# Patient Record
Sex: Male | Born: 1935 | Hispanic: Yes | Marital: Married | Smoking: Never smoker
Health system: Southern US, Community
[De-identification: ages and names within clinical notes are randomized; demographics above are authoritative.]

## PROBLEM LIST (undated history)

## (undated) ENCOUNTER — Ambulatory Visit: Payer: Medicaid Other

## (undated) DIAGNOSIS — I10 Essential (primary) hypertension: Secondary | ICD-10-CM

## (undated) DIAGNOSIS — E785 Hyperlipidemia, unspecified: Secondary | ICD-10-CM

## (undated) DIAGNOSIS — K219 Gastro-esophageal reflux disease without esophagitis: Secondary | ICD-10-CM

## (undated) HISTORY — DX: Essential (primary) hypertension: I10

## (undated) HISTORY — DX: Gastro-esophageal reflux disease without esophagitis: K21.9

## (undated) HISTORY — DX: Hyperlipidemia, unspecified: E78.5

---

## 2012-09-18 ENCOUNTER — Emergency Department (HOSPITAL_COMMUNITY): Payer: Self-pay

## 2012-09-18 ENCOUNTER — Inpatient Hospital Stay (HOSPITAL_COMMUNITY)
Admission: EM | Admit: 2012-09-18 | Discharge: 2012-09-23 | DRG: 194 | Disposition: A | Discharge status: Home / Self Care | Payer: MEDICAID | Attending: Internal Medicine | Admitting: Internal Medicine

## 2012-09-18 DIAGNOSIS — E860 Dehydration: Secondary | ICD-10-CM | POA: Diagnosis present

## 2012-09-18 DIAGNOSIS — R112 Nausea with vomiting, unspecified: Secondary | ICD-10-CM | POA: Diagnosis present

## 2012-09-18 DIAGNOSIS — J189 Pneumonia, unspecified organism: Secondary | ICD-10-CM | POA: Diagnosis present

## 2012-09-18 DIAGNOSIS — IMO0001 Reserved for inherently not codable concepts without codable children: Secondary | ICD-10-CM

## 2012-09-18 DIAGNOSIS — R1084 Generalized abdominal pain: Secondary | ICD-10-CM | POA: Diagnosis present

## 2012-09-18 DIAGNOSIS — I10 Essential (primary) hypertension: Secondary | ICD-10-CM | POA: Diagnosis present

## 2012-09-18 DIAGNOSIS — E876 Hypokalemia: Secondary | ICD-10-CM | POA: Diagnosis present

## 2012-09-18 DIAGNOSIS — I517 Cardiomegaly: Secondary | ICD-10-CM | POA: Diagnosis present

## 2012-09-18 DIAGNOSIS — J9819 Other pulmonary collapse: Secondary | ICD-10-CM | POA: Diagnosis present

## 2012-09-18 DIAGNOSIS — I152 Hypertension secondary to endocrine disorders: Secondary | ICD-10-CM | POA: Diagnosis present

## 2012-09-18 LAB — URINALYSIS, ROUTINE W REFLEX MICROSCOPIC
Bilirubin Urine: NEGATIVE
Glucose, UA: NEGATIVE mg/dL
Hgb urine dipstick: NEGATIVE
Ketones, ur: 15 mg/dL — AB
Nitrite: NEGATIVE
Specific Gravity, Urine: 1.017 (ref 1.005–1.030)
pH: 7.5 (ref 5.0–8.0)

## 2012-09-18 LAB — CBC WITH DIFFERENTIAL/PLATELET
Basophils Absolute: 0 10*3/uL (ref 0.0–0.1)
Basophils Relative: 0 % (ref 0–1)
Eosinophils Relative: 0 % (ref 0–5)
HCT: 41.5 % (ref 39.0–52.0)
Hemoglobin: 14.5 g/dL (ref 13.0–17.0)
Lymphocytes Relative: 4 % — ABNORMAL LOW (ref 12–46)
MCHC: 34.9 g/dL (ref 30.0–36.0)
Neutro Abs: 12 10*3/uL — ABNORMAL HIGH (ref 1.7–7.7)
Platelets: 198 10*3/uL (ref 150–400)
RBC: 5.21 MIL/uL (ref 4.22–5.81)
RDW: 13.1 % (ref 11.5–15.5)

## 2012-09-18 LAB — COMPREHENSIVE METABOLIC PANEL
ALT: 16 U/L (ref 0–53)
AST: 22 U/L (ref 0–37)
Albumin: 3.7 g/dL (ref 3.5–5.2)
Alkaline Phosphatase: 110 U/L (ref 39–117)
Chloride: 100 mEq/L (ref 96–112)
Potassium: 3.5 mEq/L (ref 3.5–5.1)
Sodium: 138 mEq/L (ref 135–145)
Total Bilirubin: 0.5 mg/dL (ref 0.3–1.2)
Total Protein: 8.2 g/dL (ref 6.0–8.3)

## 2012-09-18 LAB — URINE MICROSCOPIC-ADD ON

## 2012-09-18 LAB — CG4 I-STAT (LACTIC ACID): Lactic Acid, Venous: 2.13 mmol/L (ref 0.5–2.2)

## 2012-09-18 MED ORDER — HYDROMORPHONE HCL PF 1 MG/ML IJ SOLN
1.0000 mg | Freq: Once | INTRAMUSCULAR | Status: AC
  Administered 2012-09-18: 1 mg via INTRAVENOUS
  Filled 2012-09-18: qty 1

## 2012-09-18 MED ORDER — IOHEXOL 300 MG/ML  SOLN
100.0000 mL | Freq: Once | INTRAMUSCULAR | Status: AC | PRN
  Administered 2012-09-18: 100 mL via INTRAVENOUS

## 2012-09-18 MED ORDER — SODIUM CHLORIDE 0.9 % IV SOLN
1000.0000 mL | INTRAVENOUS | Status: DC
  Administered 2012-09-18: 1000 mL via INTRAVENOUS

## 2012-09-18 MED ORDER — IOHEXOL 300 MG/ML  SOLN: 20.0000 mL | INTRAMUSCULAR | Status: AC

## 2012-09-18 MED ORDER — ONDANSETRON HCL 4 MG/2ML IJ SOLN
4.0000 mg | Freq: Once | INTRAMUSCULAR | Status: AC
  Administered 2012-09-18: 4 mg via INTRAVENOUS
  Filled 2012-09-18: qty 2

## 2012-09-18 MED ORDER — SODIUM CHLORIDE 0.9 % IV SOLN
1000.0000 mL | Freq: Once | INTRAVENOUS | Status: AC
  Administered 2012-09-18: 1000 mL via INTRAVENOUS

## 2012-09-18 MED ORDER — ONDANSETRON HCL 4 MG/2ML IJ SOLN
INTRAMUSCULAR | Status: AC
  Administered 2012-09-18: 4 mg via INTRAVENOUS
  Filled 2012-09-18: qty 2

## 2012-09-18 NOTE — ED Notes (Signed)
02sat at 91 % on room air. Resp 30 pt does not speak any english. Holds his abd and arrived with emesis down both sides of his pants

## 2012-09-18 NOTE — ED Notes (Signed)
CT notified that patient has completed oral contrast.

## 2012-09-18 NOTE — ED Notes (Signed)
pts o2 level was at 88%. I put him on 2L.

## 2012-09-18 NOTE — ED Provider Notes (Signed)
History    CSN: [id number] Arrival date & time 09/18/12  Todd [name]  First MD Initiated Contact with Patient 09/18/12 1934     Chief Complaint  Patient presents with  . Nausea   (Consider location/radiation/quality/duration/timing/severity/associated sxs/prior Treatment) Patient is a 79 y.o. male presenting with abdominal pain. The history is provided by the patient and a relative. A language interpreter was used Banker fluent in Spanish interpreted for him.).  Abdominal Pain This is a new problem. The current episode started 6 to 12 hours ago. Episode frequency: Has had new onset of diffuse abdominal pain, with intermittent nausea and vomiting since 10 A.M. The problem has not changed since onset.Associated symptoms include abdominal pain. Pertinent negatives include no chest pain, no headaches and no shortness of breath. Nothing aggravates the symptoms. Nothing relieves the symptoms. He has tried nothing for the symptoms.   No past medical history on file. No past surgical history on file. No family history on file. History  Substance Use Topics  . Smoking status: Not on file  . Smokeless tobacco: Not on file  . Alcohol Use: Not on file    Review of Systems  Constitutional: Negative for fever and chills.       He had sweating with his vomiting.  HENT: Negative.   Eyes: Negative.   Respiratory: Negative.  Negative for shortness of breath.   Cardiovascular: Negative for chest pain.  Gastrointestinal: Positive for nausea, vomiting and abdominal pain. Negative for diarrhea.  Genitourinary: Negative.   Musculoskeletal: Negative.   Skin:       Profuse sweating with vomiting.  Neurological: Negative.  Negative for headaches.  Psychiatric/Behavioral: Negative.     Allergies  Review of patient's allergies indicates not on file.  Home Medications  No current outpatient prescriptions on file. BP 188/100  Pulse 90  Temp(Src) 98.6 F (37 C) (Oral)  Resp 23  SpO2 95% Physical Exam   Nursing note and vitals reviewed. Constitutional: He is oriented to person, place, and time. He appears well-developed and well-nourished.  In moderate distress, vomited in ED.  HENT:  Head: Normocephalic and atraumatic.  Right Ear: External ear normal.  Left Ear: External ear normal.  Mouth/Throat: Oropharynx is clear and moist.  Eyes: Conjunctivae and EOM are normal. Pupils are equal, round, and reactive to light. No scleral icterus.  Neck: Normal range of motion. Neck supple.  Cardiovascular: Normal rate, regular rhythm and normal heart sounds.   Pulmonary/Chest: Effort normal and breath sounds normal.  Abdominal: Soft. Bowel sounds are normal. He exhibits distension. He exhibits no mass. There is tenderness. There is no rebound and no guarding.  Musculoskeletal: Normal range of motion. He exhibits no edema and no tenderness.  Neurological: He is alert and oriented to person, place, and time.  No sensory or motor deficit.  Skin: Skin is warm and dry.  Psychiatric: He has a normal mood and affect. His behavior is normal.    ED Course  Procedures (including critical care time) Labs Reviewed  COMPREHENSIVE METABOLIC PANEL  LIPASE, BLOOD  URINALYSIS, ROUTINE W REFLEX MICROSCOPIC  CBC WITH DIFFERENTIAL   7:58 PM Pt was seen and had physical examination.  Lab workup was ordered.  IV fluids, IV medications for pain and nausea were ordered.   11:56 PM Results for orders placed during the hospital encounter of 09/18/12  COMPREHENSIVE METABOLIC PANEL      Result Value Range   Sodium 138  135 - 145 mEq/L   Potassium 3.5  3.5 -  5.1 mEq/L   Chloride 100  96 - 112 mEq/L   CO2 26  19 - 32 mEq/L   Glucose, Bld 178 (*) 70 - 99 mg/dL   BUN 12  6 - 23 mg/dL   Creatinine, Ser 8.11  0.50 - 1.35 mg/dL   Calcium 9.1  8.4 - 91.4 mg/dL   Total Protein 8.2  6.0 - 8.3 g/dL   Albumin 3.7  3.5 - 5.2 g/dL   AST 22  0 - 37 U/L   ALT 16  0 - 53 U/L   Alkaline Phosphatase 110  39 - 117 U/L    Total Bilirubin 0.5  0.3 - 1.2 mg/dL   GFR calc non Af Amer 80 (*) >90 mL/min   GFR calc Af Amer >90  >90 mL/min  LIPASE, BLOOD      Result Value Range   Lipase 23  11 - 59 U/L  URINALYSIS, ROUTINE W REFLEX MICROSCOPIC      Result Value Range   Color, Urine YELLOW  YELLOW   APPearance CLOUDY (*) CLEAR   Specific Gravity, Urine 1.017  1.005 - 1.030   pH 7.5  5.0 - 8.0   Glucose, UA NEGATIVE  NEGATIVE mg/dL   Hgb urine dipstick NEGATIVE  NEGATIVE   Bilirubin Urine NEGATIVE  NEGATIVE   Ketones, ur 15 (*) NEGATIVE mg/dL   Protein, ur 30 (*) NEGATIVE mg/dL   Urobilinogen, UA 1.0  0.0 - 1.0 mg/dL   Nitrite NEGATIVE  NEGATIVE   Leukocytes, UA NEGATIVE  NEGATIVE  CBC WITH DIFFERENTIAL      Result Value Range   WBC 12.6 (*) 4.0 - 10.5 K/uL   RBC 5.21  4.22 - 5.81 MIL/uL   Hemoglobin 14.5  13.0 - 17.0 g/dL   HCT 78.2  95.6 - 21.3 %   MCV 79.7  78.0 - 100.0 fL   MCH 27.8  26.0 - 34.0 pg   MCHC 34.9  30.0 - 36.0 g/dL   RDW 08.6  57.8 - 46.9 %   Platelets 198  150 - 400 K/uL   Neutrophils Relative % 95 (*) 43 - 77 %   Neutro Abs 12.0 (*) 1.7 - 7.7 K/uL   Lymphocytes Relative 4 (*) 12 - 46 %   Lymphs Abs 0.5 (*) 0.7 - 4.0 K/uL   Monocytes Relative 1 (*) 3 - 12 %   Monocytes Absolute 0.1  0.1 - 1.0 K/uL   Eosinophils Relative 0  0 - 5 %   Eosinophils Absolute 0.0  0.0 - 0.7 K/uL   Basophils Relative 0  0 - 1 %   Basophils Absolute 0.0  0.0 - 0.1 K/uL  URINE MICROSCOPIC-ADD ON      Result Value Range   Squamous Epithelial / LPF RARE  RARE   WBC, UA 0-2  <3 WBC/hpf   RBC / HPF 0-2  <3 RBC/hpf   Bacteria, UA RARE  RARE  CG4 I-STAT (LACTIC ACID)      Result Value Range   Lactic Acid, Venous 2.13  0.5 - 2.2 mmol/L   Ct Abdomen Pelvis W Contrast  09/18/2012   *RADIOLOGY REPORT*  Clinical Data: Nausea and diffuse abdominal pain.  CT ABDOMEN AND PELVIS WITH CONTRAST  Technique:  Multidetector CT imaging of the abdomen and pelvis was performed following the standard protocol during bolus  administration of intravenous contrast.  Contrast: OMNIPAQUE IOHEXOL 300 MG/ML  SOLN  Comparison: None.  Findings: There are patchy densities at both lung  bases which could represent atelectasis.  Difficult to exclude an infectious process. No evidence for free intraperitoneal air.  Multiple low-density structures in the liver are most compatible with cysts.  The largest is in the lateral left hepatic lobe and measures 3.0 cm.  Normal appearance of the portal venous system and gallbladder.  Normal appearance of the spleen, pancreas and adrenal glands.  There are round low density structures in the right kidney.  The largest measures 1.4 cm and the Hounsfield units are indeterminate, measuring roughly 25.  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  No evidence for an aortic aneurysm.  There is plaque in the right common iliac artery. Urinary bladder is distended.  Few calcifications involving the prostate gland.  Normal appearance of the seminal vesicles.  No gross abnormality to the large bowel.  There is a low density filling defect in a small bowel loop in the mid abdomen on sequence 2, image 55.  This may represent a focal collection of fluid.  No evidence for bowel obstruction or dilatation.  Normal appearance of the appendix.  Degenerative disease at L4-L5.  No acute bony abnormality.  IMPRESSION: No acute abnormalities in the abdomen or pelvis.  Patchy densities in the lower lobes may represent atelectasis but cannot exclude an acute infectious or inflammatory process.  Indeterminate 1.4 cm low density structure involving the right kidney.  Hounsfield units are not compatible with a simple cyst. The structure may be large enough to characterize with ultrasound. Otherwise, this structure could be further evaluated with a pre and postcontrast CT or MR.  Multiple low density structures in the liver are suggestive for cysts.   Original Report Authenticated By: Richarda Overlie, M.D.   12:03  AM Lab workup showed CBC with WBC elevated at 12,600 with 95% neutrophils.  Chemistries showed glucose elevated at 178.  Urinalysis is negative.  CT of the abdomen and pelvis is negative for intra-abdominal pathology. Recheck of the patient shows he feels about the same, with nausea and a feeling of numbness in his body.  He has no abdominal tenderness, moves his arms and legs well, is awake, alert, answers questions posed by me through the nurse.  Recommend admission for observation for vomiting, IV rehydration.  12:43 AM Case discussed with Dr. Onalee Hua.  Admit to observation to a telemetry bed, [facility].  1. Nausea and vomiting       Carleene Cooper III, MD 09/19/12 [id number]

## 2012-09-18 NOTE — ED Notes (Signed)
Pt noted to be throwing up by registration.  Verbal ordered obtained for 4mg  of Zofran.

## 2012-09-18 NOTE — ED Notes (Signed)
Pt slowly drinking PO contrast.  Family assisting pt

## 2012-09-18 NOTE — ED Notes (Signed)
Pt returned from CT. Requesting water instructed pt and family to await CT results before drinking anything.  Pt states that pain med helped "alot"

## 2012-09-18 NOTE — ED Notes (Signed)
Family and Dr. Ignacia Palma to bedside with translator.  Pt family states he has had abd pain, voiting and weakness for 1-2 days.

## 2012-09-18 NOTE — ED Notes (Signed)
Attempted to void.  No success. Family remains at bedside assisting pt with PO contrast

## 2012-09-18 NOTE — ED Notes (Signed)
Pt to CT scanner via stretcher.

## 2012-09-19 ENCOUNTER — Emergency Department (HOSPITAL_COMMUNITY): Payer: Self-pay

## 2012-09-19 ENCOUNTER — Encounter (HOSPITAL_COMMUNITY): Payer: Self-pay | Admitting: *Deleted

## 2012-09-19 DIAGNOSIS — I152 Hypertension secondary to endocrine disorders: Secondary | ICD-10-CM | POA: Diagnosis present

## 2012-09-19 DIAGNOSIS — E860 Dehydration: Secondary | ICD-10-CM | POA: Diagnosis present

## 2012-09-19 DIAGNOSIS — J189 Pneumonia, unspecified organism: Secondary | ICD-10-CM | POA: Diagnosis present

## 2012-09-19 DIAGNOSIS — R112 Nausea with vomiting, unspecified: Secondary | ICD-10-CM | POA: Diagnosis present

## 2012-09-19 DIAGNOSIS — R1084 Generalized abdominal pain: Secondary | ICD-10-CM | POA: Diagnosis present

## 2012-09-19 DIAGNOSIS — R52 Pain, unspecified: Secondary | ICD-10-CM

## 2012-09-19 DIAGNOSIS — R030 Elevated blood-pressure reading, without diagnosis of hypertension: Secondary | ICD-10-CM

## 2012-09-19 LAB — HIV ANTIBODY (ROUTINE TESTING W REFLEX): HIV: NONREACTIVE

## 2012-09-19 LAB — STREP PNEUMONIAE URINARY ANTIGEN: Strep Pneumo Urinary Antigen: NEGATIVE

## 2012-09-19 MED ORDER — ONDANSETRON HCL 4 MG/2ML IJ SOLN
4.0000 mg | Freq: Four times a day (QID) | INTRAMUSCULAR | Status: DC | PRN
  Administered 2012-09-19 – 2012-09-21 (×4): 4 mg via INTRAVENOUS
  Filled 2012-09-19 (×4): qty 2

## 2012-09-19 MED ORDER — SODIUM CHLORIDE 0.9 % IV SOLN
INTRAVENOUS | Status: AC
  Administered 2012-09-19: 03:00:00 via INTRAVENOUS

## 2012-09-19 MED ORDER — ENOXAPARIN SODIUM 40 MG/0.4ML ~~LOC~~ SOLN
40.0000 mg | SUBCUTANEOUS | Status: DC
  Administered 2012-09-19 – 2012-09-23 (×5): 40 mg via SUBCUTANEOUS
  Filled 2012-09-19 (×5): qty 0.4

## 2012-09-19 MED ORDER — DEXTROSE 5 % IV SOLN
500.0000 mg | INTRAVENOUS | Status: DC
  Administered 2012-09-19 – 2012-09-20 (×2): 500 mg via INTRAVENOUS
  Filled 2012-09-19 (×2): qty 500

## 2012-09-19 MED ORDER — SODIUM CHLORIDE 0.9 % IV SOLN: INTRAVENOUS | Status: DC

## 2012-09-19 MED ORDER — ONDANSETRON HCL 4 MG PO TABS: 4.0000 mg | ORAL_TABLET | Freq: Four times a day (QID) | ORAL | Status: DC | PRN

## 2012-09-19 MED ORDER — ONDANSETRON HCL 4 MG/2ML IJ SOLN: 4.0000 mg | INTRAMUSCULAR | Status: DC | PRN

## 2012-09-19 MED ORDER — DEXTROSE 5 % IV SOLN
1.0000 g | INTRAVENOUS | Status: DC
  Administered 2012-09-19 – 2012-09-20 (×2): 1 g via INTRAVENOUS
  Filled 2012-09-19 (×2): qty 10

## 2012-09-19 MED ORDER — HYDRALAZINE HCL 20 MG/ML IJ SOLN
10.0000 mg | Freq: Four times a day (QID) | INTRAMUSCULAR | Status: DC | PRN
  Administered 2012-09-19: 10 mg via INTRAVENOUS
  Filled 2012-09-19: qty 1

## 2012-09-19 MED ORDER — HYDROMORPHONE HCL PF 1 MG/ML IJ SOLN
1.0000 mg | INTRAMUSCULAR | Status: DC | PRN
  Administered 2012-09-19: 1 mg via INTRAVENOUS
  Filled 2012-09-19: qty 1

## 2012-09-19 NOTE — Progress Notes (Signed)
Pt c/o nausea, PRN zofran as ordered, pt having poor po intake, family at bedside, exit care notes on PNA given to pt and family, translator phone used, pt stable

## 2012-09-19 NOTE — Plan of Care (Signed)
Problem: Phase I Progression Outcomes Goal: Pain controlled with appropriate interventions Outcome: Completed/Met Date Met:  09/19/12 Pt has not had any c/o pain Goal: Voiding-avoid urinary catheter unless indicated Outcome: Completed/Met Date Met:  09/19/12 Pt voiding adequate amts of urine no need for foley

## 2012-09-19 NOTE — H&P (Signed)
PCP:   No primary provider on file.   Chief Complaint:  n/v  HPI: 79 yo male with one day of n/v and general abd pain.  Vomit nonbloody.  Has not been feeling well.  No diarrhea.  No fevers.  No cough.  Not seen a doctor in a long time.  W/u in ED reveals ? Pna.  Pt says he feels better.  Mainly spanish speaking.  Review of Systems:  Positive and negative as per HPI otherwise all other systems are negative  Past Medical History: none   Medications: Prior to Admission medications   Not on File    Allergies:  No Known Allergies  Social History: Neg x 3  Family History: none  Physical Exam: Filed Vitals:   09/18/12 2145 09/18/12 2200 09/18/12 2215 09/18/12 2240  BP: 176/96 174/94 172/90 181/87  Pulse: 89 89 93   Temp:      TempSrc:      Resp: 22 22 23    SpO2: 94% 94% 96%    General appearance: alert, cooperative and no distress Head: Normocephalic, without obvious abnormality, atraumatic Eyes: negative Nose: Nares normal. Septum midline. Mucosa normal. No drainage or sinus tenderness. Neck: no JVD and supple, symmetrical, trachea midline Lungs: clear to auscultation bilaterally Heart: regular rate and rhythm, S1, S2 normal, no murmur, click, rub or gallop Abdomen: soft, non-tender; bowel sounds normal; no masses,  no organomegaly Extremities: extremities normal, atraumatic, no cyanosis or edema Pulses: 2+ and symmetric Skin: Skin color, texture, turgor normal. No rashes or lesions Neurologic: Grossly normal    Labs on Admission:   Recent Labs  09/18/12 1957  NA 138  K 3.5  CL 100  CO2 26  GLUCOSE 178*  BUN 12  CREATININE 0.86  CALCIUM 9.1    Recent Labs  09/18/12 1957  AST 22  ALT 16  ALKPHOS 110  BILITOT 0.5  PROT 8.2  ALBUMIN 3.7    Recent Labs  09/18/12 1957  LIPASE 23    Recent Labs  09/18/12 1957  WBC 12.6*  NEUTROABS 12.0*  HGB 14.5  HCT 41.5  MCV 79.7  PLT 198   Radiological Exams on Admission: Dg Chest 2  View  09/19/2012   *RADIOLOGY REPORT*  Clinical Data: Nausea and vomiting for 2 days.  CHEST - 2 VIEW  Comparison: CT of the chest performed 09/18/2012  Findings: The lungs are hypoexpanded.  Bibasilar airspace opacities raise concern for pneumonia, particularly on the left.  No pleural effusion or pneumothorax is seen.  The cardiomediastinal silhouette is borderline enlarged.  No acute osseous abnormalities are identified.  IMPRESSION:  1.  Lungs hypoexpanded.  Bibasilar airspace opacities raise concern for pneumonia, particularly on the left. 2.  Borderline cardiomegaly.   Original Report Authenticated By: Tonia Ghent, M.D.   Ct Abdomen Pelvis W Contrast  09/18/2012   *RADIOLOGY REPORT*  Clinical Data: Nausea and diffuse abdominal pain.  CT ABDOMEN AND PELVIS WITH CONTRAST  Technique:  Multidetector CT imaging of the abdomen and pelvis was performed following the standard protocol during bolus administration of intravenous contrast.  Contrast: OMNIPAQUE IOHEXOL 300 MG/ML  SOLN  Comparison: None.  Findings: There are patchy densities at both lung bases which could represent atelectasis.  Difficult to exclude an infectious process. No evidence for free intraperitoneal air.  Multiple low-density structures in the liver are most compatible with cysts.  The largest is in the lateral left hepatic lobe and measures 3.0 cm.  Normal appearance of the portal  venous system and gallbladder.  Normal appearance of the spleen, pancreas and adrenal glands.  There are round low density structures in the right kidney.  The largest measures 1.4 cm and the Hounsfield units are indeterminate, measuring roughly 25.  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  No evidence for an aortic aneurysm.  There is plaque in the right common iliac artery. Urinary bladder is distended.  Few calcifications involving the prostate gland.  Normal appearance of the seminal vesicles.  No gross abnormality to the large bowel.   There is a low density filling defect in a small bowel loop in the mid abdomen on sequence 2, image 55.  This may represent a focal collection of fluid.  No evidence for bowel obstruction or dilatation.  Normal appearance of the appendix.  Degenerative disease at L4-L5.  No acute bony abnormality.  IMPRESSION: No acute abnormalities in the abdomen or pelvis.  Patchy densities in the lower lobes may represent atelectasis but cannot exclude an acute infectious or inflammatory process.  Indeterminate 1.4 cm low density structure involving the right kidney.  Hounsfield units are not compatible with a simple cyst. The structure may be large enough to characterize with ultrasound. Otherwise, this structure could be further evaluated with a pre and postcontrast CT or MR.  Multiple low density structures in the liver are suggestive for cysts.   Original Report Authenticated By: Richarda Overlie, M.D.    Assessment/Plan  79 yo male with CAP, n/v abd pain Principal Problem:   CAP (community acquired pneumonia) Active Problems:   Abdominal pain, acute, generalized   Nausea & vomiting   Elevated BP   Dehydration, mild  pna pathway. ???also underlying ileus.  abd exam is benign and ct neg.  Place on clear liq diet.  Rocephin and azithro iv.  Probably has also underlying htn.  Supportive care.  Full code.  Tele floor.  Stefanee Mckell A 09/19/2012, 1:25 AM

## 2012-09-19 NOTE — Progress Notes (Signed)
   1:58 PM I agree with HPI/GPe and A/P per Dr. Eldridge Dace       Patient Active Problem List   Diagnosis Date Noted  . CAP (community acquired pneumonia) 09/19/2012  . Abdominal pain, acute, generalized 09/19/2012  . Nausea & vomiting 09/19/2012  . Elevated BP 09/19/2012  . Dehydration, mild 09/19/2012   Doing well.  Hungry. No further n/v-states the vomiting seemed post tussive.  Denies cp, sob, diarrhea etc etc Discussed with patient and will allow diet, cont IVf and ambulate Likely transition to oral abx am  Pleas Koch, MD [facility] [phone]

## 2012-09-20 ENCOUNTER — Observation Stay (HOSPITAL_COMMUNITY): Payer: Self-pay

## 2012-09-20 LAB — LEGIONELLA ANTIGEN, URINE: Legionella Antigen, Urine: NEGATIVE

## 2012-09-20 LAB — COMPREHENSIVE METABOLIC PANEL
ALT: 16 U/L (ref 0–53)
AST: 40 U/L — ABNORMAL HIGH (ref 0–37)
Albumin: 3.4 g/dL — ABNORMAL LOW (ref 3.5–5.2)
Alkaline Phosphatase: 88 U/L (ref 39–117)
BUN: 13 mg/dL (ref 6–23)
CO2: 27 mEq/L (ref 19–32)
Calcium: 8.9 mg/dL (ref 8.4–10.5)
Calcium: 8.9 mg/dL (ref 8.4–10.5)
Chloride: 98 mEq/L (ref 96–112)
Creatinine, Ser: 0.94 mg/dL (ref 0.50–1.35)
GFR calc Af Amer: 75 mL/min — ABNORMAL LOW (ref >90)
GFR calc non Af Amer: 65 mL/min — ABNORMAL LOW (ref >90)
Glucose, Bld: 95 mg/dL (ref 70–99)
Sodium: 140 mEq/L (ref 135–145)

## 2012-09-20 LAB — CBC WITH DIFFERENTIAL/PLATELET
Eosinophils Relative: 1 % (ref 0–5)
HCT: 41.6 % (ref 39.0–52.0)
Hemoglobin: 13.6 g/dL (ref 13.0–17.0)
Lymphocytes Relative: 14 % (ref 12–46)
Lymphs Abs: 1.7 10*3/uL (ref 0.7–4.0)
MCV: 81.1 fL (ref 78.0–100.0)
Monocytes Relative: 5 % (ref 3–12)
Platelets: 200 10*3/uL (ref 150–400)
RBC: 5.13 MIL/uL (ref 4.22–5.81)
WBC: 12.2 10*3/uL — ABNORMAL HIGH (ref 4.0–10.5)

## 2012-09-20 MED ORDER — POTASSIUM CHLORIDE CRYS ER 20 MEQ PO TBCR
40.0000 meq | EXTENDED_RELEASE_TABLET | Freq: Every day | ORAL | Status: DC
  Administered 2012-09-20 – 2012-09-23 (×4): 40 meq via ORAL
  Filled 2012-09-20 (×4): qty 2

## 2012-09-20 MED ORDER — LEVOFLOXACIN 500 MG PO TABS
500.0000 mg | ORAL_TABLET | ORAL | Status: DC
  Administered 2012-09-20 – 2012-09-22 (×3): 500 mg via ORAL
  Filled 2012-09-20 (×4): qty 1

## 2012-09-20 MED ORDER — AMLODIPINE BESYLATE 5 MG PO TABS
5.0000 mg | ORAL_TABLET | Freq: Every day | ORAL | Status: DC
  Administered 2012-09-20 – 2012-09-23 (×4): 5 mg via ORAL
  Filled 2012-09-20 (×4): qty 1

## 2012-09-20 NOTE — Progress Notes (Signed)
Patient c/o of nausea.  PRN IV Zofran given x1.  Patient resting comfortably at bedside in no acute distress. RN will continue to monitor. Louretta Parma, RN

## 2012-09-20 NOTE — Evaluation (Signed)
Physical Therapy Evaluation Patient Details Name: Todd [name] MRN: [id number] DOB: 18-Dec-1930 Today's Date: 09/20/2012 Time: 4098-1191 PT Time Calculation (min): 22 min  PT Assessment / Plan / Recommendation History of Present Illness  79 yo male with one day of n/v and general abd pain.  Vomit nonbloody.  Has not been feeling well.  No diarrhea.  No fevers.  No cough.  Not seen a doctor in a long time.  W/u in ED reveals ? Pna.  Pt says he feels better.  Mainly spanish speaking.  Clinical Impression  Pt admitted with N/V and abdominal pain.  In general not feeling well.  Working dx is PNA.   Pt currently with functional limitations due to the deficits listed below (see PT Problem List).  Pt will benefit from skilled PT to increase their independence and safety with mobility to allow discharge to home with some family assist as needed.     PT Assessment  Patient needs continued PT services    Follow Up Recommendations  No PT follow up ( Unless pt does not progress as expected.)    Does the patient have the potential to tolerate intense rehabilitation      Barriers to Discharge        Equipment Recommendations  None recommended by PT    Recommendations for Other Services     Frequency Min 3X/week    Precautions / Restrictions Precautions Precautions: Fall Restrictions Weight Bearing Restrictions: No   Pertinent Vitals/Pain       Mobility  Bed Mobility Bed Mobility: Supine to Sit;Sitting - Scoot to Edge of Bed;Sit to Supine Supine to Sit: 4: Min assist;HOB flat Sitting - Scoot to Edge of Bed: 4: Min assist Sit to Supine: 4: Min assist Transfers Transfers: Sit to Stand;Stand to Sit Sit to Stand: 4: Min assist;From bed;With upper extremity assist Stand to Sit: 4: Min guard;To bed Details for Transfer Assistance: steadying assist Ambulation/Gait Ambulation/Gait Assistance: 4: Min assist (initially 2 person assist then ) Ambulation Distance (Feet):   (180) Assistive device: 1 person hand held assist;2 person hand held assist Ambulation/Gait Assistance Details: short stacatto steps with arms held at high guard position.  Moderately unsteady when ask to walk unassisted. Gait Pattern: Step-through pattern;Scissoring;Narrow base of support Gait velocity: slower Stairs: No Wheelchair Mobility Wheelchair Mobility: No    Exercises     PT Diagnosis: Difficulty walking;Generalized weakness  PT Problem List: Decreased strength;Decreased activity tolerance;Decreased balance;Decreased mobility;Cardiopulmonary status limiting activity PT Treatment Interventions: Gait training;Stair training;Functional mobility training;Therapeutic activities;Balance training;Patient/family education     PT Goals(Current goals can be found in the care plan section) Acute Rehab PT Goals Patient Stated Goal: get home family stated for him PT Goal Formulation: With patient Time For Goal Achievement: 09/27/12 Potential to Achieve Goals: Good  Visit Information  Last PT Received On: 09/20/12 Assistance Needed: +1 History of Present Illness: 79 yo male with one day of n/v and general abd pain.  Vomit nonbloody.  Has not been feeling well.  No diarrhea.  No fevers.  No cough.  Not seen a doctor in a long time.  W/u in ED reveals ? Pna.  Pt says he feels better.  Mainly spanish speaking.       Prior Functioning  Home Living Family/patient expects to be discharged to:: Private residence Living Arrangements: Spouse/significant other Available Help at Discharge: Family;Other (Comment) (wife and ?dtr to stay about 10 days) Type of Home: House Home Access: Stairs to enter [employer] of Steps: 4  Entrance Stairs-Rails: Right;Left Home Layout: One level Home Equipment: None Additional Comments: stand up shower and low toilets [name] Communication Communication: Prefers language other than English     Cognition  Cognition Arousal/Alertness: Awake/alert Behavior During Therapy: WFL for tasks assessed/performed Overall Cognitive Status: Within Functional Limits for tasks assessed    Extremity/Trunk Assessment Lower Extremity Assessment Lower Extremity Assessment: Generalized weakness Cervical / Trunk Assessment Cervical / Trunk Assessment: Normal   Balance Balance Balance Assessed: No  End of Session PT - End of Session Activity Tolerance: Patient tolerated treatment well;Patient limited by fatigue Patient left: in bed;with call bell/phone within reach;with family/visitor present Nurse Communication: Mobility status  GP     Anyssa Sharpless, Eliseo Gum 09/20/2012, 5:29 PM

## 2012-09-20 NOTE — Progress Notes (Signed)
Interpreter phone used, pt stated he feels fine and wants to go home, pt has not had any c/o n/v or pain today, pts family at bedside, pt still has poor po intake, ensures offered, will continue to monitor

## 2012-09-20 NOTE — Progress Notes (Signed)
[place] ZOX:[id number] DOB: Jul 14, 1930 DOA: 09/18/2012 PCP: No primary provider on file.  Brief narrative: 79 yr old [place] male with no specific chronic illnesses admitted with ? PNa, however CXR equivocal for Pna   Past medical history-As per Problem list Chart reviewed as below- none  Consultants:  none  Procedures:  cxr 7/14  cxr 7/15  Antibiotics:  Azithromcin 7/14  Ceftriaxone 7/14   Subjective  Alert pleasant oriented, nad Had n yesterday no v. No diarr, no tarry stool/black stool  No sob No antecedent LE swelling or dyspnea   Objective    Interim History: nad  Telemetry: nsr  Objective: Filed Vitals:   09/19/12 1841 09/19/12 1900 09/20/12 0505 09/20/12 1335  BP:  163/86 181/90 185/94  Pulse:  92 93 98  Temp: 97.5 F (36.4 C) 98.5 F (36.9 C) 98.1 F (36.7 C) 98 F (36.7 C)  TempSrc: Oral Oral Oral Oral  Resp:  20 16 20   Height:      Weight:   71.3 kg (157 lb 3 oz)   SpO2:  95% 94% 93%    Intake/Output Summary (Last 24 hours) at 09/20/12 1551 Last data filed at 09/20/12 1247  Gross per 24 hour  Intake    420 ml  Output    925 ml  Net   -505 ml    Exam:  General: alert pleasant oreitne din nad Cardiovascular: s1 s2 no m/r/g Respiratory: mild crackels Abdomen: soft nt nd Skin tattoo r leg Neuro intact  Data Reviewed: Basic Metabolic Panel:  Recent Labs Lab 09/18/12 1957 09/20/12 0437  NA 138 140  K 3.5 2.9*  CL 100 102  CO2 26 27  GLUCOSE 178* 95  BUN 12 13  CREATININE 0.86 1.06  CALCIUM 9.1 8.9   Liver Function Tests:  Recent Labs Lab 09/18/12 1957 09/20/12 0437  AST 22 34  ALT 16 16  ALKPHOS 110 88  BILITOT 0.5 0.6  PROT 8.2 7.2  ALBUMIN 3.7 3.2*    Recent Labs Lab 09/18/12 1957  LIPASE 23   No results found for this basename: AMMONIA,  in the last 168 hours CBC:  Recent Labs Lab 09/18/12 1957 09/20/12 0437  WBC 12.6* 12.2*  NEUTROABS 12.0* 9.8*  HGB 14.5 13.6  HCT 41.5  41.6  MCV 79.7 81.1  PLT 198 200   Cardiac Enzymes: No results found for this basename: CKTOTAL, CKMB, CKMBINDEX, TROPONINI,  in the last 168 hours BNP: No components found with this basename: POCBNP,  CBG: No results found for this basename: GLUCAP,  in the last 168 hours  Recent Results (from the past 240 hour(s))  CULTURE, BLOOD (ROUTINE X 2)     Status: None   Collection Time    09/19/12  3:10 AM      Result Value Range Status   Specimen Description BLOOD RIGHT ARM   Final   Special Requests BOTTLES DRAWN AEROBIC ONLY 10CC   Final   Culture  Setup Time 09/19/2012 12:18   Final   Culture     Final   Value:        BLOOD CULTURE RECEIVED NO GROWTH TO DATE CULTURE WILL BE HELD FOR 5 DAYS BEFORE ISSUING A FINAL NEGATIVE REPORT   Report Status PENDING   Incomplete  CULTURE, BLOOD (ROUTINE X 2)     Status: None   Collection Time    09/19/12  3:18 AM      Result Value Range Status   Specimen Description  BLOOD RIGHT HAND   Final   Special Requests BOTTLES DRAWN AEROBIC ONLY 10CC   Final   Culture  Setup Time 09/19/2012 12:18   Final   Culture     Final   Value:        BLOOD CULTURE RECEIVED NO GROWTH TO DATE CULTURE WILL BE HELD FOR 5 DAYS BEFORE ISSUING A FINAL NEGATIVE REPORT   Report Status PENDING   Incomplete     Studies:              All Imaging reviewed and is as per above notation   Scheduled Meds: . enoxaparin (LOVENOX) injection  40 mg Subcutaneous Q24H   Continuous Infusions:    Assessment/Plan: 1. ?PNA-rpt CXr today still equivocal-transition to PO levaquin 500 daily.  Duration 5 days.  CBC am 2. N/v Unclear if post-tussive.  Monitor 3. Htn-Start amlodipine 5.  might need to up-titrate if no control 4. CXR=?CHF-possible given HTN h/o-no h/o Heart disease-at present no indication for lasix for diuresis-suggest out-patient f/u 5. Hypokalemia-replace orally Kdur 40   Code Status: full Family Communication:  Discussed with family at bedside Disposition Plan:  inpt   Pleas Koch, MD  [facility] Pager [phone] 09/20/2012, 3:51 PM    LOS: 2 days

## 2012-09-20 NOTE — Plan of Care (Signed)
Problem: Phase I Progression Outcomes Goal: Initial discharge plan identified Outcome: Completed/Met Date Met:  09/20/12 Initial plan is to return home with family  Problem: Phase II Progression Outcomes Goal: Vital signs remain stable Outcome: Completed/Met Date Met:  09/20/12 vss

## 2012-09-21 DIAGNOSIS — I517 Cardiomegaly: Secondary | ICD-10-CM | POA: Diagnosis present

## 2012-09-21 LAB — CBC
HCT: 42.2 % (ref 39.0–52.0)
MCV: 79.6 fL (ref 78.0–100.0)
RBC: 5.3 MIL/uL (ref 4.22–5.81)
WBC: 9.9 10*3/uL (ref 4.0–10.5)

## 2012-09-21 LAB — COMPREHENSIVE METABOLIC PANEL
AST: 37 U/L (ref 0–37)
BUN: 12 mg/dL (ref 6–23)
CO2: 29 mEq/L (ref 19–32)
Chloride: 101 mEq/L (ref 96–112)
Creatinine, Ser: 1 mg/dL (ref 0.50–1.35)
GFR calc non Af Amer: 69 mL/min — ABNORMAL LOW (ref >90)
Total Bilirubin: 0.5 mg/dL (ref 0.3–1.2)

## 2012-09-21 MED ORDER — LISINOPRIL 10 MG PO TABS
10.0000 mg | ORAL_TABLET | Freq: Every day | ORAL | Status: DC
  Administered 2012-09-21 – 2012-09-22 (×2): 10 mg via ORAL
  Filled 2012-09-21 (×2): qty 1

## 2012-09-21 NOTE — Progress Notes (Signed)
Physical Therapy Treatment Patient Details Name: Todd [name] MRN: [id number] DOB: 1930/08/07 Today's Date: 09/21/2012 Time: 4098-1191 PT Time Calculation (min): 20 min  PT Assessment / Plan / Recommendation  PT Comments   Seen with OT and used interpreter line to introduce therapy and roles.  Son who speaks English entered room shortly after so son ambulated with pt and therapists and assisted with translation.  Educated to use RW for safety due to balance deficits and have 24/7 supervision.  Pt denies pain and SOB with mobility.    Follow Up Recommendations  No PT follow up     Does the patient have the potential to tolerate intense rehabilitation     Barriers to Discharge        Equipment Recommendations  None recommended by PT    Recommendations for Other Services    Frequency     Progress towards PT Goals Progress towards PT goals: Progressing toward goals  Plan Current plan remains appropriate    Precautions / Restrictions Precautions Precautions: Fall Restrictions Weight Bearing Restrictions: No   Pertinent Vitals/Pain See mobility section    Mobility  Bed Mobility Bed Mobility: Not assessed Sit to Supine: 6: Modified independent (Device/Increase time) Transfers Transfers: Sit to Stand;Stand to Sit Sit to Stand: 4: Min guard;From bed Stand to Sit: 4: Min guard;To bed Details for Transfer Assistance: steadying assist Ambulation/Gait Ambulation/Gait Assistance: 4: Min assist Ambulation Distance (Feet): 200 Feet Assistive device: Rolling walker Ambulation/Gait Assistance Details: attempted with no assist however pt with LOB x1 due to narrowing BOS requiring min assist to correct so educated pt and son on safe use of RW Gait Pattern: Step-through pattern;Scissoring;Narrow base of support General Gait Details: SaO2 94% room air during ambulation and 96% room air upon return to room    Exercises Other Exercises Other Exercises: encouraged BU/LE exercises to  perform with family   PT Diagnosis:    PT Problem List:   PT Treatment Interventions:     PT Goals (current goals can now be found in the care plan section) Acute Rehab PT Goals Patient Stated Goal: get home family stated for him  Visit Information  Last PT Received On: 09/21/12 Assistance Needed: +1 History of Present Illness: 79 yo male with one day of n/v and general abd pain.  Vomit nonbloody.  Has not been feeling well.  No diarrhea.  No fevers.  No cough.  Not seen a doctor in a long time.  W/u in ED reveals ? Pna.  Pt says he feels better.  Mainly spanish speaking.    Subjective Data  Patient Stated Goal: get home family stated for him   Cognition  Cognition Arousal/Alertness: Awake/alert Behavior During Therapy: WFL for tasks assessed/performed Overall Cognitive Status: Within Functional Limits for tasks assessed    Balance  Balance Balance Assessed: No High Level Balance High Level Balance Activites: Other (comment) (LOB during ambulation. Improved with RW)  End of Session PT - End of Session Equipment Utilized During Treatment: Gait belt Activity Tolerance: Patient tolerated treatment well Patient left: in bed;with call bell/phone within reach;with family/visitor present   GP     Todd [name],Todd [name] 09/21/2012, 4:08 PM Todd [name], PT, DPT 09/21/2012 Pager: [phone]

## 2012-09-21 NOTE — Progress Notes (Signed)
Pt ambulated in hallway with PT, tolerated well, family at bedside, vss, will monitor

## 2012-09-21 NOTE — Progress Notes (Signed)
Pt c/o having a full feeling after taking meds with nausea at times, pt has difficulty taking pills, meds given with applesauce and tolerated well, no nausea at this time, will continue to monitor

## 2012-09-21 NOTE — Discharge Summary (Addendum)
Physician Discharge Summary  Mar[place] ZOX:[id number] DOB: 04-Jun-1930 DOA: 09/18/2012  PCP: No primary provider on file.  Admit date: 09/18/2012 Discharge date: 09/21/2012  Time spent:  Recommendations for Outpatient Follow-up:  1. Cardiomegaly; patient from [place] and has had limited healthcare. Presented with nausea vomiting and diagnosed with cardiomegaly. 2-D cardiac echo showed severe LVH.. 2. HTN; still uncontrolled Will increase lisinopril to 40 mg daily + continue Norvasc 24+.. labetalol 200 mg twice a day 3. CAP; continue levofloxacin day 4 /7 4. Family conference; conductive family conference with sons and daughters present discussed plan of care with them. Discussed obtaining cardiac echo in the a.m. as well as working on reducing his blood pressure to acceptable level. The concern is that wife is still in [place] and if his vacation is extended how would day go about obtaining a letter to request a visa for wife to come to [place]. Patient has been started on several new medications to control his HTN and improved functioning of his heart will need close followup and support of family  Discharge Diagnoses:  Principal Problem:   CAP (community acquired pneumonia) Active Problems:   Abdominal pain, acute, generalized   Nausea & vomiting   Elevated BP   Dehydration, mild   Discharge Condition: Stable  Diet recommendation: Heart healthy  Filed Weights   09/19/12 0300 09/20/12 0505 09/21/12 0521  Weight: 72.7 kg (160 lb 4.4 oz) 71.3 kg (157 lb 3 oz) 71.714 kg (158 lb 1.6 oz)    History of present illness:  79 yo HM Fm [place] (non-English speaker) visiting family PMHx unknown. Admitted forone day of n/v and general abd pain. Vomit nonbloody. Has not been feeling well. No diarrhea. No fevers. No cough. Not seen a doctor in a long time. Patient is doing well, no adverse reaction to any of the new medications are added, daughter and with  patient.     Hospital Course: 79 year old Hispanic male diagnosed with cardiomegaly and severe LVH most likely secondary to long history of uncontrolled HTN. Had aggressively treated patient's HTN and he'll patient is safe for discharge at this time. Counseled patient that he would need to follow up and establish care with a PCP and continue to work on decreasing his BP.   Procedures: CXR 09/20/2012 1. Cardiomegaly with bibasilar and right perihilar primarily  linear opacities favoring atelectasis, although pneumonia is not  totally excluded.  2. Low lung volumes are present, causing crowding of the pulmonary  Vasculature.  2DCardiac Echo 04/25/2012 Left ventricle: The cavity size was normal. Wall thickness was increased in a pattern of severe LVH. LVEF= 65% to 70%. Doppler parameters are consistent with abnormal left ventricular relaxation (grade 1 diastolic dysfunction).      Consultations:    Discharge Exam: Filed Vitals:   09/21/12 0521 09/21/12 0958 09/21/12 1430 09/21/12 1500  BP: 170/97 188/75 181/92   Pulse: 95  89   Temp: 97.6 F (36.4 C)  98.3 F (36.8 C)   TempSrc: Oral  Oral   Resp: 19  18   Height:      Weight: 71.714 kg (158 lb 1.6 oz)     SpO2: 98%  97% 96%    General:  Alert,NAD (son translated interaction) Cardiovascular:  Regular rhythm and rate, negative murmurs rubs or gallops, DP/PT pulse 2+ Respiratory: clear to auscultation bilateral  Discharge Instructions     Medication List    Notice   You have not been prescribed any medications.  No Known Allergies    The results of significant diagnostics from this hospitalization (including imaging, microbiology, ancillary and laboratory) are listed below for reference.    Significant Diagnostic Studies: Dg Chest 2 View  09/20/2012   *RADIOLOGY REPORT*  Clinical Data: Nausea vomiting.  Diaphoresis.  CHEST - 2 VIEW  Comparison: 09/19/2012  Findings: Very low lung volumes noted with  indistinct left basilar opacities and primarily linear right basilar opacities.  Increased right perihilar band-like opacity. Cardiomegaly is present.  The patient is rotated to the right on today's exam, resulting in reduced diagnostic sensitivity and specificity.  IMPRESSION:  1.  Cardiomegaly with bibasilar and right perihilar primarily linear opacities favoring atelectasis, although pneumonia is not totally excluded. 2. Low lung volumes are present, causing crowding of the pulmonary vasculature.   Original Report Authenticated By: Gaylyn Rong, M.D.   Dg Chest 2 View  09/19/2012   *RADIOLOGY REPORT*  Clinical Data: Nausea and vomiting for 2 days.  CHEST - 2 VIEW  Comparison: CT of the chest performed 09/18/2012  Findings: The lungs are hypoexpanded.  Bibasilar airspace opacities raise concern for pneumonia, particularly on the left.  No pleural effusion or pneumothorax is seen.  The cardiomediastinal silhouette is borderline enlarged.  No acute osseous abnormalities are identified.  IMPRESSION:  1.  Lungs hypoexpanded.  Bibasilar airspace opacities raise concern for pneumonia, particularly on the left. 2.  Borderline cardiomegaly.   Original Report Authenticated By: Tonia Ghent, M.D.   Ct Abdomen Pelvis W Contrast  09/18/2012   *RADIOLOGY REPORT*  Clinical Data: Nausea and diffuse abdominal pain.  CT ABDOMEN AND PELVIS WITH CONTRAST  Technique:  Multidetector CT imaging of the abdomen and pelvis was performed following the standard protocol during bolus administration of intravenous contrast.  Contrast: OMNIPAQUE IOHEXOL 300 MG/ML  SOLN  Comparison: None.  Findings: There are patchy densities at both lung bases which could represent atelectasis.  Difficult to exclude an infectious process. No evidence for free intraperitoneal air.  Multiple low-density structures in the liver are most compatible with cysts.  The largest is in the lateral left hepatic lobe and measures 3.0 cm.  Normal  appearance of the portal venous system and gallbladder.  Normal appearance of the spleen, pancreas and adrenal glands.  There are round low density structures in the right kidney.  The largest measures 1.4 cm and the Hounsfield units are indeterminate, measuring roughly 25.  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  No evidence for an aortic aneurysm.  There is plaque in the right common iliac artery. Urinary bladder is distended.  Few calcifications involving the prostate gland.  Normal appearance of the seminal vesicles.  No gross abnormality to the large bowel.  There is a low density filling defect in a small bowel loop in the mid abdomen on sequence 2, image 55.  This may represent a focal collection of fluid.  No evidence for bowel obstruction or dilatation.  Normal appearance of the appendix.  Degenerative disease at L4-L5.  No acute bony abnormality.  IMPRESSION: No acute abnormalities in the abdomen or pelvis.  Patchy densities in the lower lobes may represent atelectasis but cannot exclude an acute infectious or inflammatory process.  Indeterminate 1.4 cm low density structure involving the right kidney.  Hounsfield units are not compatible with a simple cyst. The structure may be large enough to characterize with ultrasound. Otherwise, this structure could be further evaluated with a pre and postcontrast CT or MR.  Multiple low density  structures in the liver are suggestive for cysts.   Original Report Authenticated By: Richarda Overlie, M.D.    Microbiology: Recent Results (from the past 240 hour(s))  CULTURE, BLOOD (ROUTINE X 2)     Status: None   Collection Time    09/19/12  3:10 AM      Result Value Range Status   Specimen Description BLOOD RIGHT ARM   Final   Special Requests BOTTLES DRAWN AEROBIC ONLY 10CC   Final   Culture  Setup Time 09/19/2012 12:18   Final   Culture     Final   Value:        BLOOD CULTURE RECEIVED NO GROWTH TO DATE CULTURE WILL BE HELD FOR 5 DAYS BEFORE  ISSUING A FINAL NEGATIVE REPORT   Report Status PENDING   Incomplete  CULTURE, BLOOD (ROUTINE X 2)     Status: None   Collection Time    09/19/12  3:18 AM      Result Value Range Status   Specimen Description BLOOD RIGHT HAND   Final   Special Requests BOTTLES DRAWN AEROBIC ONLY 10CC   Final   Culture  Setup Time 09/19/2012 12:18   Final   Culture     Final   Value:        BLOOD CULTURE RECEIVED NO GROWTH TO DATE CULTURE WILL BE HELD FOR 5 DAYS BEFORE ISSUING A FINAL NEGATIVE REPORT   Report Status PENDING   Incomplete     Labs: Basic Metabolic Panel:  Recent Labs Lab 09/18/12 1957 09/20/12 0437 09/20/12 1430 09/21/12 0635  NA 138 140 137 139  K 3.5 2.9* 3.0* 3.4*  CL 100 102 98 101  CO2 26 27 28 29   GLUCOSE 178* 95 144* 103*  BUN 12 13 11 12   CREATININE 0.86 1.06 0.94 1.00  CALCIUM 9.1 8.9 8.9 9.4   Liver Function Tests:  Recent Labs Lab 09/18/12 1957 09/20/12 0437 09/20/12 1430 09/21/12 0635  AST 22 34 40* 37  ALT 16 16 17 19   ALKPHOS 110 88 94 92  BILITOT 0.5 0.6 0.4 0.5  PROT 8.2 7.2 7.6 7.6  ALBUMIN 3.7 3.2* 3.4* 3.1*    Recent Labs Lab 09/18/12 1957  LIPASE 23   No results found for this basename: AMMONIA,  in the last 168 hours CBC:  Recent Labs Lab 09/18/12 1957 09/20/12 0437 09/21/12 0635  WBC 12.6* 12.2* 9.9  NEUTROABS 12.0* 9.8*  --   HGB 14.5 13.6 14.6  HCT 41.5 41.6 42.2  MCV 79.7 81.1 79.6  PLT 198 200 209   Cardiac Enzymes: No results found for this basename: CKTOTAL, CKMB, CKMBINDEX, TROPONINI,  in the last 168 hours BNP: BNP (last 3 results)  Recent Labs  09/20/12 1351  PROBNP 376.3   CBG: No results found for this basename: GLUCAP,  in the last 168 hours     Signed:  Carolyne Littles, J  [facility] 09/21/2012, 5:06 PM

## 2012-09-21 NOTE — Progress Notes (Signed)
Pt not being d/c today per MD, BP elevated, will give meds as ordered and monitor BP, pt and family at bedside while MD used interpreter phone to translate, pt and family verbalized understanding, will monitor

## 2012-09-21 NOTE — Progress Notes (Signed)
Occupational Therapy Evaluation Patient Details Name: Todd [name] MRN: [id number] DOB: May 12, 1930 Today's Date: 09/21/2012 Time: 7829-5621 OT Time Calculation (min): 21 min  OT Assessment / Plan / Recommendation History of present illness 79 yo male with one day of n/v and general abd pain.  Vomit nonbloody.  Has not been feeling well.  No diarrhea.  No fevers.  No cough.  Not seen a doctor in a long time.  W/u in ED reveals ? Pna.  Pt says he feels better.  Mainly spanish speaking.   Clinical Impression   [name] PTA, pt independent with ADL and mobility. Pt presents with decreased endurance and balance, however, much improved with RW. Family can provide 24/7 S. Discussed importance of 24/7 S and assistance s/p D/C. Family verbalized understanding. [name]    OT Assessment  [name]    Follow Up Recommendations  No OT follow up;Supervision/Assistance - 24 hour    Barriers to Discharge      Equipment Recommendations  Other (comment) (RW)    Recommendations for Other Services    Frequency       Precautions / Restrictions Precautions Precautions: Fall Restrictions Weight Bearing Restrictions: No   Pertinent Vitals/Pain O2 96 after ambulation RA    ADL  ADL Comments: overall min A provided by family    OT Diagnosis:    OT Problem List:   OT Treatment Interventions:     OT Goals(Current goals can be found in the care plan section) Acute Rehab OT Goals Patient Stated Goal: get home family stated for him  Visit Information  Last OT Received On: 09/21/12 Assistance Needed: +1 History of Present Illness: 79 yo male with one day of n/v and general abd pain.  Vomit nonbloody.  Has not been feeling well.  No diarrhea.  No fevers.  No cough.  Not seen a doctor in a long time.  W/u in ED reveals ? Pna.  Pt says he feels better.  Mainly spanish speaking.        Prior Functioning     Home Living Family/patient expects to be discharged to:: Private residence Living Arrangements: Spouse/significant other Available Help at Discharge: Family;Other (Comment) (wife and ?dtr to stay about 10 days) Type of Home: House Home Access: Stairs to enter [employer] of Steps: 4 Entrance Stairs-Rails: Right;Left Home Layout: One level Home Equipment: None Additional Comments: stand up shower and low toilets [name] Communication Communication: Prefers language other than English         Vision/Perception     Cognition  Cognition Arousal/Alertness: Awake/alert Behavior During Therapy: WFL for tasks assessed/performed Overall Cognitive Status: Within Functional Limits for tasks assessed    Extremity/Trunk Assessment Upper Extremity Assessment Upper Extremity Assessment: Overall WFL for tasks assessed Lower Extremity Assessment Lower Extremity Assessment: Defer to PT evaluation Cervical / Trunk Assessment Cervical / Trunk Assessment: Normal     Mobility Bed Mobility Bed Mobility: Not assessed Transfers Sit to Stand: 4: Min assist;From bed;With upper extremity assist Stand to Sit: 4: Min guard;To bed Details for Transfer Assistance: steadying assist     Exercise Other Exercises Other Exercises: encouraged BU/LE exercises to perform with family   Balance Balance Balance Assessed: No High Level Balance High Level Balance Activites: Other (comment) (LOB during ambulation. Improved with RW)   End of Session OT - End of Session Equipment Utilized During Treatment:  Gait belt;Rolling walker Activity Tolerance: Patient tolerated treatment well Patient left: in bed;with call bell/phone within reach;with family/visitor present Nurse Communication: Mobility status  GO     Todd [name],HILLARY 09/21/2012, 4:05 PM [facility], OTR/L  [phone] 09/21/2012

## 2012-09-22 DIAGNOSIS — I10 Essential (primary) hypertension: Secondary | ICD-10-CM | POA: Diagnosis present

## 2012-09-22 MED ORDER — LISINOPRIL 40 MG PO TABS
40.0000 mg | ORAL_TABLET | Freq: Every day | ORAL | Status: DC
Start: 2012-09-23 — End: 2012-09-24
  Administered 2012-09-23: 40 mg via ORAL
  Filled 2012-09-22: qty 1

## 2012-09-22 MED ORDER — LISINOPRIL 10 MG PO TABS
10.0000 mg | ORAL_TABLET | Freq: Once | ORAL | Status: AC
  Administered 2012-09-22: 10 mg via ORAL
  Filled 2012-09-22: qty 1

## 2012-09-22 MED ORDER — LISINOPRIL 20 MG PO TABS
20.0000 mg | ORAL_TABLET | Freq: Once | ORAL | Status: AC
  Administered 2012-09-22: 20 mg via ORAL
  Filled 2012-09-22: qty 1

## 2012-09-22 NOTE — Progress Notes (Signed)
  Echocardiogram 2D Echocardiogram has been performed.  Todd [name] [name] 09/22/2012, 9:43 AM

## 2012-09-22 NOTE — Progress Notes (Signed)
Pt mostly asleep during the shift. C/o once of Nausea no vomiting noted. Zofran given, nausea resolved. Continued to monitor patient

## 2012-09-23 DIAGNOSIS — I10 Essential (primary) hypertension: Secondary | ICD-10-CM

## 2012-09-23 DIAGNOSIS — I517 Cardiomegaly: Secondary | ICD-10-CM

## 2012-09-23 MED ORDER — LABETALOL HCL 200 MG PO TABS: 200.0000 mg | ORAL_TABLET | Freq: Two times a day (BID) | ORAL | Status: DC

## 2012-09-23 MED ORDER — LABETALOL HCL 200 MG PO TABS
200.0000 mg | ORAL_TABLET | Freq: Two times a day (BID) | ORAL | Status: DC
Start: 2012-09-23 — End: 2012-09-24
  Administered 2012-09-23: 200 mg via ORAL
  Filled 2012-09-23: qty 1

## 2012-09-23 MED ORDER — LABETALOL HCL 100 MG PO TABS
100.0000 mg | ORAL_TABLET | Freq: Two times a day (BID) | ORAL | Status: DC
  Administered 2012-09-23: 100 mg via ORAL
  Filled 2012-09-23 (×2): qty 1

## 2012-09-23 MED ORDER — LEVOFLOXACIN 500 MG PO TABS: 500.0000 mg | ORAL_TABLET | ORAL | Status: DC

## 2012-09-23 MED ORDER — AMLODIPINE BESYLATE 5 MG PO TABS: 5.0000 mg | ORAL_TABLET | Freq: Every day | ORAL | Status: DC

## 2012-09-23 MED ORDER — LISINOPRIL 40 MG PO TABS: 40.0000 mg | ORAL_TABLET | Freq: Every day | ORAL | Status: DC

## 2012-09-23 NOTE — Progress Notes (Signed)
Physical Therapy Treatment Patient Details Name: Todd [name] MRN: [id number] DOB: 11/10/1930 Today's Date: 09/23/2012 Time: 4098-1191 PT Time Calculation (min): 20 min  PT Assessment / Plan / Recommendation  PT Comments   Pt able to increase ambulation distance and complete stair negotiation.  Continue to recommend RW for long distances.  Also recommended HHPT to improve pt's overall mobility.  Son did not seem interested in RW or HHPT (possible due to finances).  Pt will benefit from HHPT but will be able to safely d/c from PT standpoint with 24 hour assistance if family does not want HHPT.   Follow Up Recommendations  No PT follow up     Equipment Recommendations  Rolling walker with 5" wheels    Frequency Min 3X/week   Progress towards PT Goals Progress towards PT goals: Progressing toward goals  Plan Discharge plan needs to be updated    Precautions / Restrictions Precautions Precautions: Fall   Pertinent Vitals/Pain No c/o pain    Mobility  Bed Mobility Bed Mobility: Supine to Sit;Sit to Supine Supine to Sit: 5: Supervision;With rails Sitting - Scoot to Edge of Bed: 5: Supervision;With rail Sit to Supine: 6: Modified independent (Device/Increase time) Transfers Transfers: Sit to Stand;Stand to Sit Sit to Stand: 4: Min guard;From bed Stand to Sit: 4: Min guard;To bed Details for Transfer Assistance: Minguard for safety with cues for hand placement Ambulation/Gait Ambulation/Gait Assistance: 4: Min assist Ambulation Distance (Feet): 200 Feet Assistive device: Rolling walker Ambulation/Gait Assistance Details: Intermittent min (A) due to LOB x 2 with lateral sway.  Cues for RW placement and increase step length and upright posture. Gait Pattern: Step-through pattern;Scissoring;Narrow base of support;Shuffle Gait velocity: slower Stairs: Yes Stairs Assistance: 4: Min Editor, commissioning Details (indicate cue type and reason): (A) to maintain balance with  cues for step sequence Stair Management Technique: One rail Left;Backwards Number of Stairs: 4 Wheelchair Mobility Wheelchair Mobility: No    Exercises     PT Diagnosis:    PT Problem List:   PT Treatment Interventions:     PT Goals (current goals can now be found in the care plan section) Acute Rehab PT Goals Patient Stated Goal: get home family stated for him PT Goal Formulation: With patient Time For Goal Achievement: 09/27/12 Potential to Achieve Goals: Good  Visit Information  Last PT Received On: 09/23/12 Assistance Needed: +1 History of Present Illness: 79 yo male with one day of n/v and general abd pain.  Vomit nonbloody.  Has not been feeling well.  No diarrhea.  No fevers.  No cough.  Not seen a doctor in a long time.  W/u in ED reveals ? Pna.  Pt says he feels better.  Mainly spanish speaking.    Subjective Data  Subjective: Little verbalization due to spanish speaking.  Son present to translate for pt Patient Stated Goal: get home family stated for him   Cognition  Cognition Arousal/Alertness: Awake/alert Behavior During Therapy: WFL for tasks assessed/performed Overall Cognitive Status: Within Functional Limits for tasks assessed    Balance  Balance Balance Assessed: No  End of Session PT - End of Session Equipment Utilized During Treatment: Gait belt Activity Tolerance: Patient tolerated treatment well Patient left: in bed;with call bell/phone within reach;with family/visitor present Nurse Communication: Mobility status   GP     Todd [name] 09/23/2012, 10:11 AM   [name], PT DPT [phone]

## 2012-09-23 NOTE — Progress Notes (Signed)
The patient rested comfortably throughout the night.

## 2012-09-24 NOTE — Progress Notes (Signed)
The patient was discharged after completing the discharge instructions via an interpreter.  He was taken via wheelchair by a nurse tech to a car with his family.

## 2012-09-25 LAB — CULTURE, BLOOD (ROUTINE X 2): Culture: NO GROWTH

## 2014-07-07 IMAGING — CT CT ABD-PELV W/ CM
2 of 5 series · 16 of 46 positions shown, 18 images · IV contrast (APPLIED)
Comparison: None.

CLINICAL DATA: Nausea and diffuse abdominal pain.

CT ABDOMEN AND PELVIS WITH CONTRAST
TECHNIQUE: Multidetector CT imaging of the abdomen and pelvis was
performed following the standard protocol during bolus
administration of intravenous contrast.
Contrast: 100mL OMNIPAQUE IOHEXOL 300 MG/ML  SOLN

[Series 2: abd/ pelvis 5.0 i30f 1 · axial · 0.74mm/px · z∈[+638,+1054]mm · 13 of 93 slices shown, 15 images]
[im 5/93  soft-tissue]
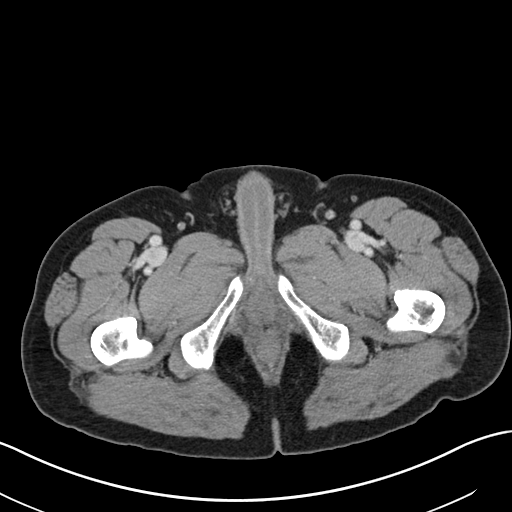
[im 5/93  bone]
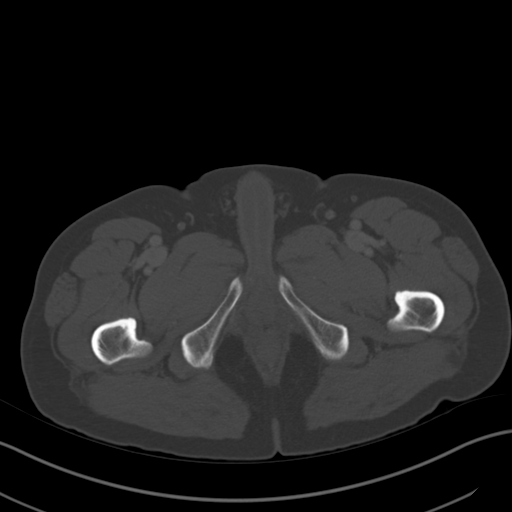
[im 14/93  soft-tissue]
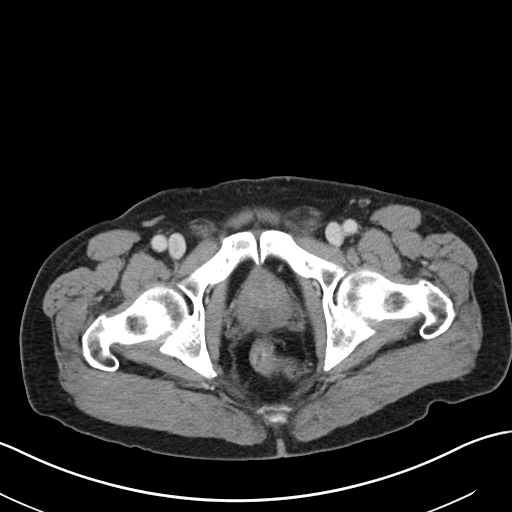
[im 19/93  soft-tissue]
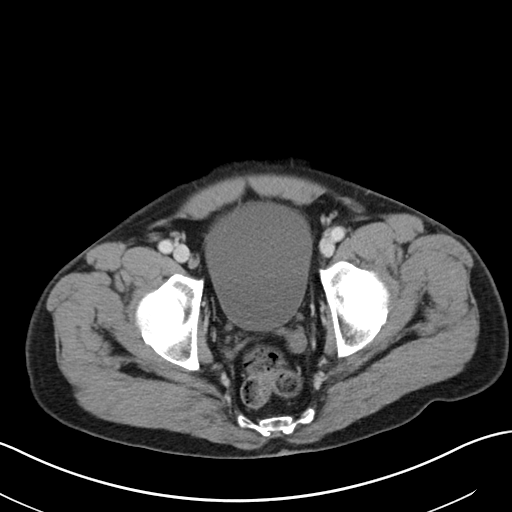
[im 28/93  soft-tissue]
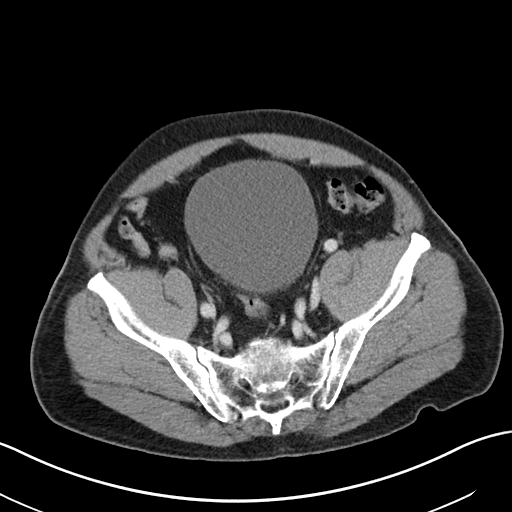
[im 33/93  soft-tissue]
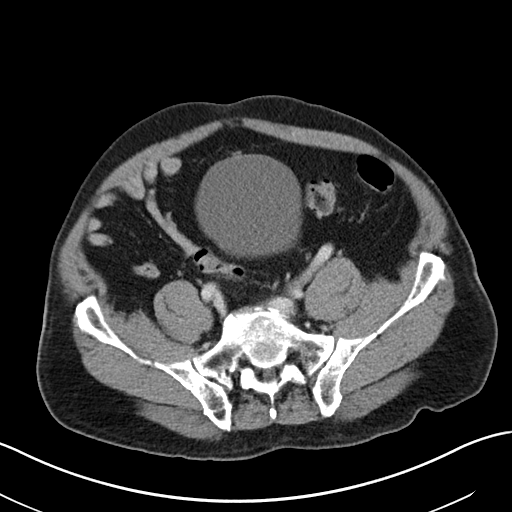
[im 42/93  soft-tissue]
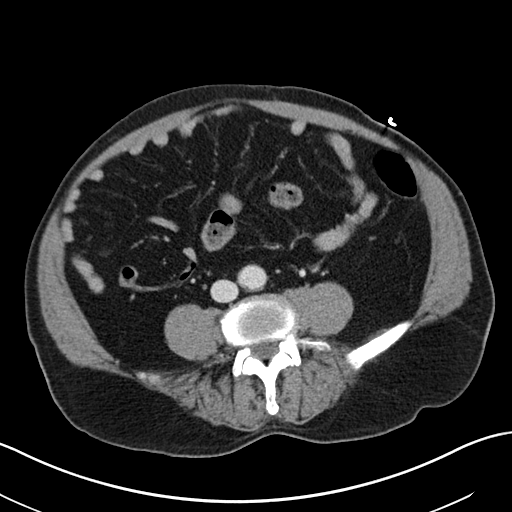
[im 47/93  soft-tissue]
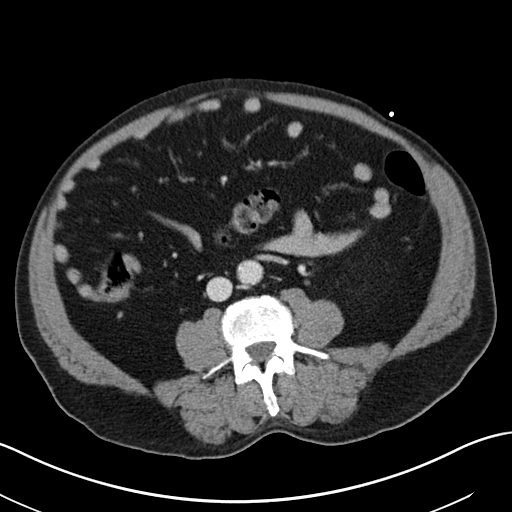
[im 51/93  soft-tissue]
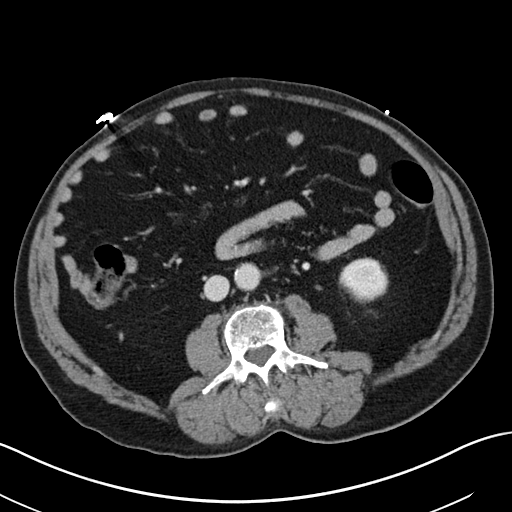
[im 60/93  soft-tissue]
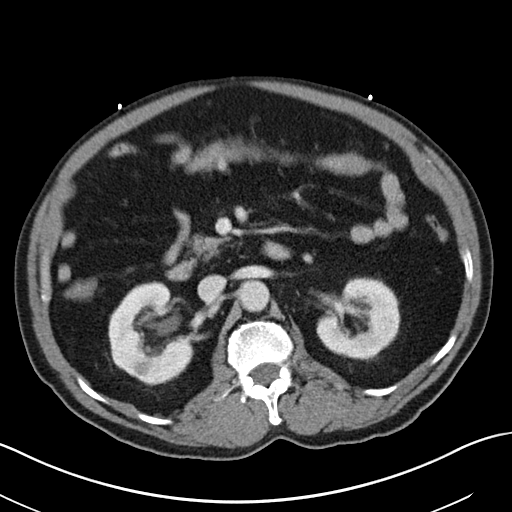
[im 60/93  bone]
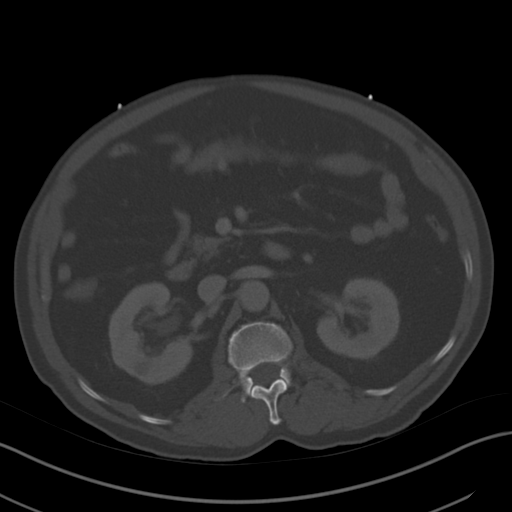
[im 65/93  soft-tissue]
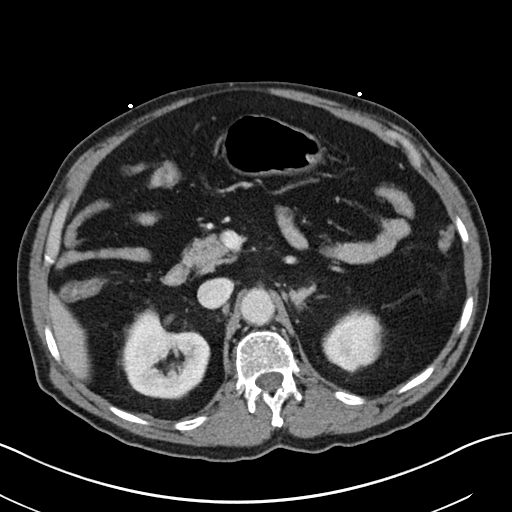
[im 74/93  soft-tissue]
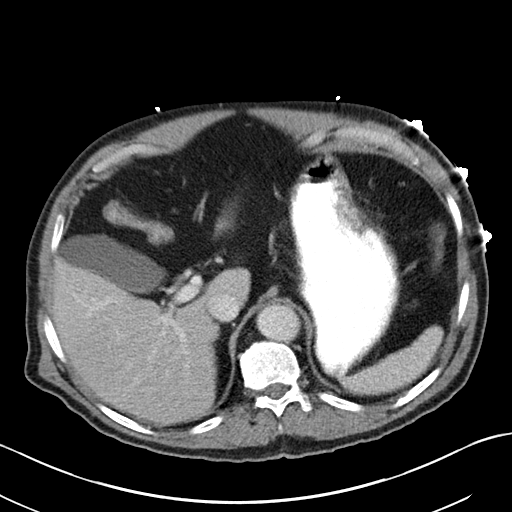
[im 79/93  soft-tissue]
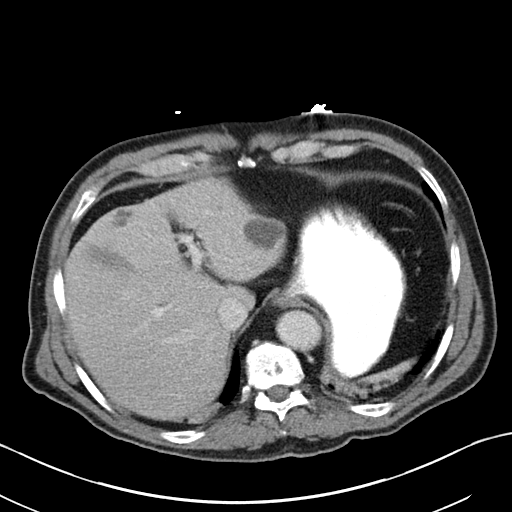
[im 88/93  soft-tissue]
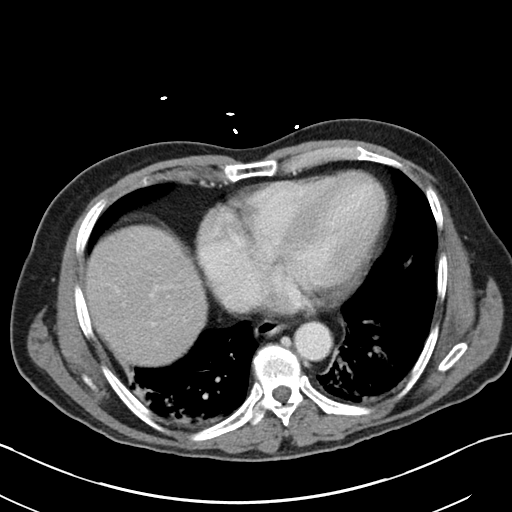

[Series 5: cor · coronal · 0.72mm/px · 3 of 140 slices shown]
[im 47/140  soft-tissue]
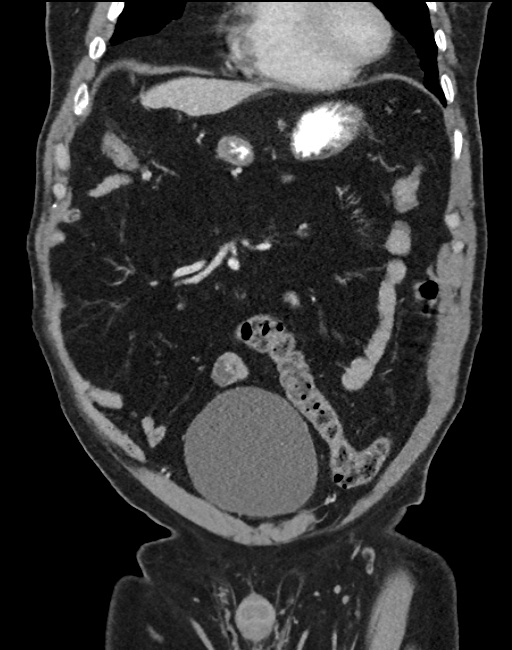
[im 62/140  soft-tissue]
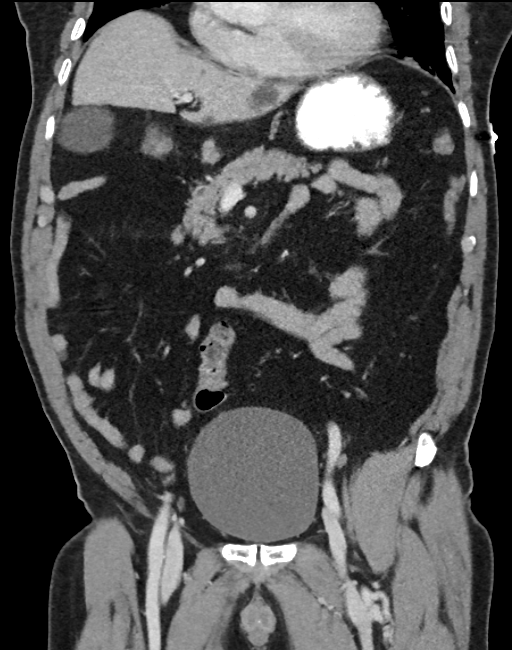
[im 78/140  soft-tissue]
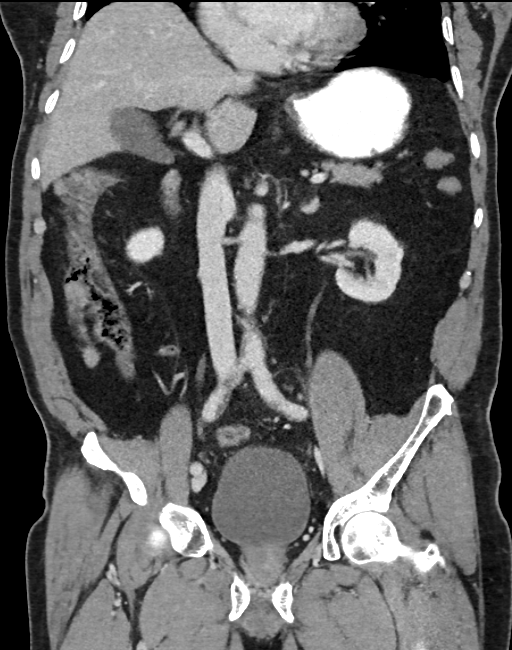

[16 of 46 positions shown; findings below may reference images not displayed]

FINDINGS: There are patchy densities at both lung bases which could
represent atelectasis.  Difficult to exclude an infectious process.
No evidence for free intraperitoneal air.

Multiple low-density structures in the liver are most compatible
with cysts.  The largest is in the lateral left hepatic lobe and
measures 3.0 cm.  Normal appearance of the portal venous system and
gallbladder.  Normal appearance of the spleen, pancreas and adrenal
glands.  There are round low density structures in the right
kidney.  The largest measures 1.4 cm and the Hounsfield units are
indeterminate, measuring roughly 25.  There appears to be
duplicated renal arteries bilaterally.  No evidence for an aortic
aneurysm.  There is plaque in the right common iliac artery.
Urinary bladder is distended.  Few calcifications involving the
prostate gland.  Normal appearance of the seminal vesicles.

No gross abnormality to the large bowel.  There is a low density
filling defect in a small bowel loop in the mid abdomen on sequence
2, image 55.  This may represent a focal collection of fluid.  No
evidence for bowel obstruction or dilatation.  Normal appearance of
the appendix.

Degenerative disease at L4-L5.  No acute bony abnormality.
IMPRESSION: No acute abnormalities in the abdomen or pelvis.

Patchy densities in the lower lobes may represent atelectasis but
cannot exclude an acute infectious or inflammatory process.

Indeterminate 1.4 cm low density structure involving the right
kidney.  Hounsfield units are not compatible with a simple cyst.
The structure may be large enough to characterize with ultrasound.
Otherwise, this structure could be further evaluated with a pre and
postcontrast CT or MR.

Multiple low density structures in the liver are suggestive for
cysts.

## 2014-07-08 IMAGING — CR DG CHEST 2V
2 series · 2 of 2 positions shown · non-contrast
Comparison: CT of the chest performed 09/18/2012

CLINICAL DATA: Nausea and vomiting for 2 days.

CHEST - 2 VIEW

[x chest ap]
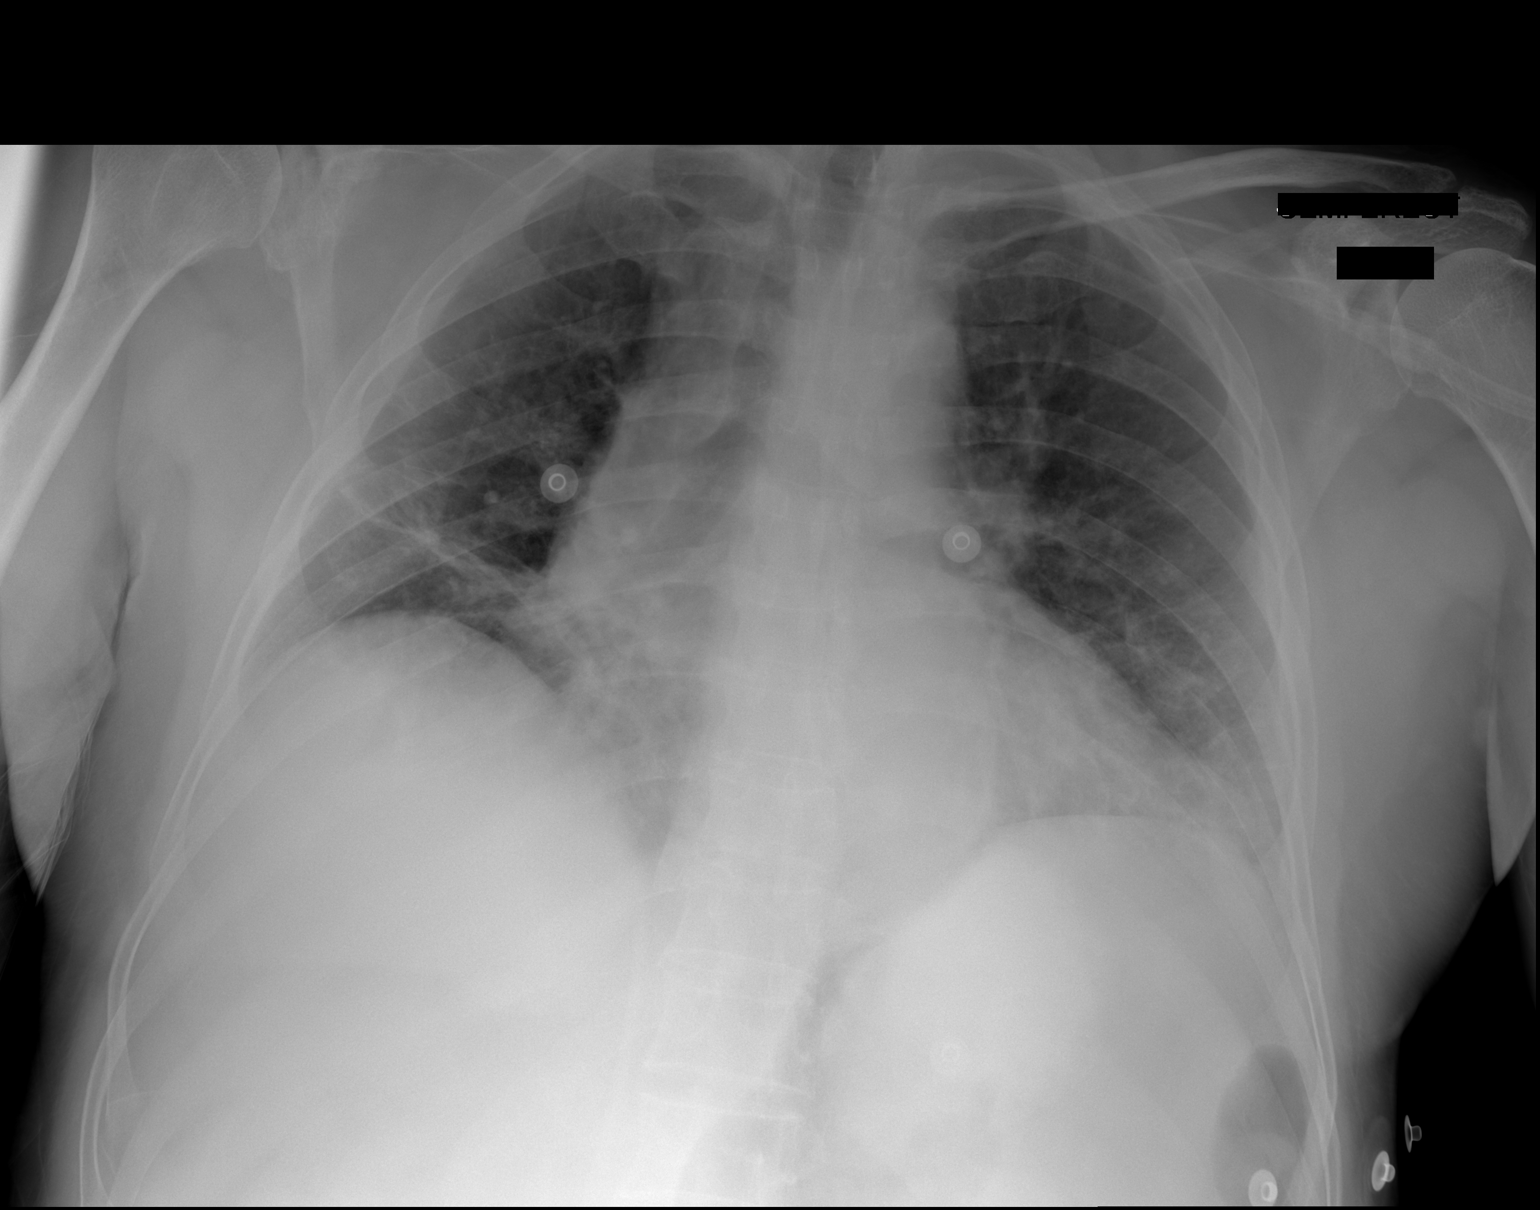

[w chest lat]
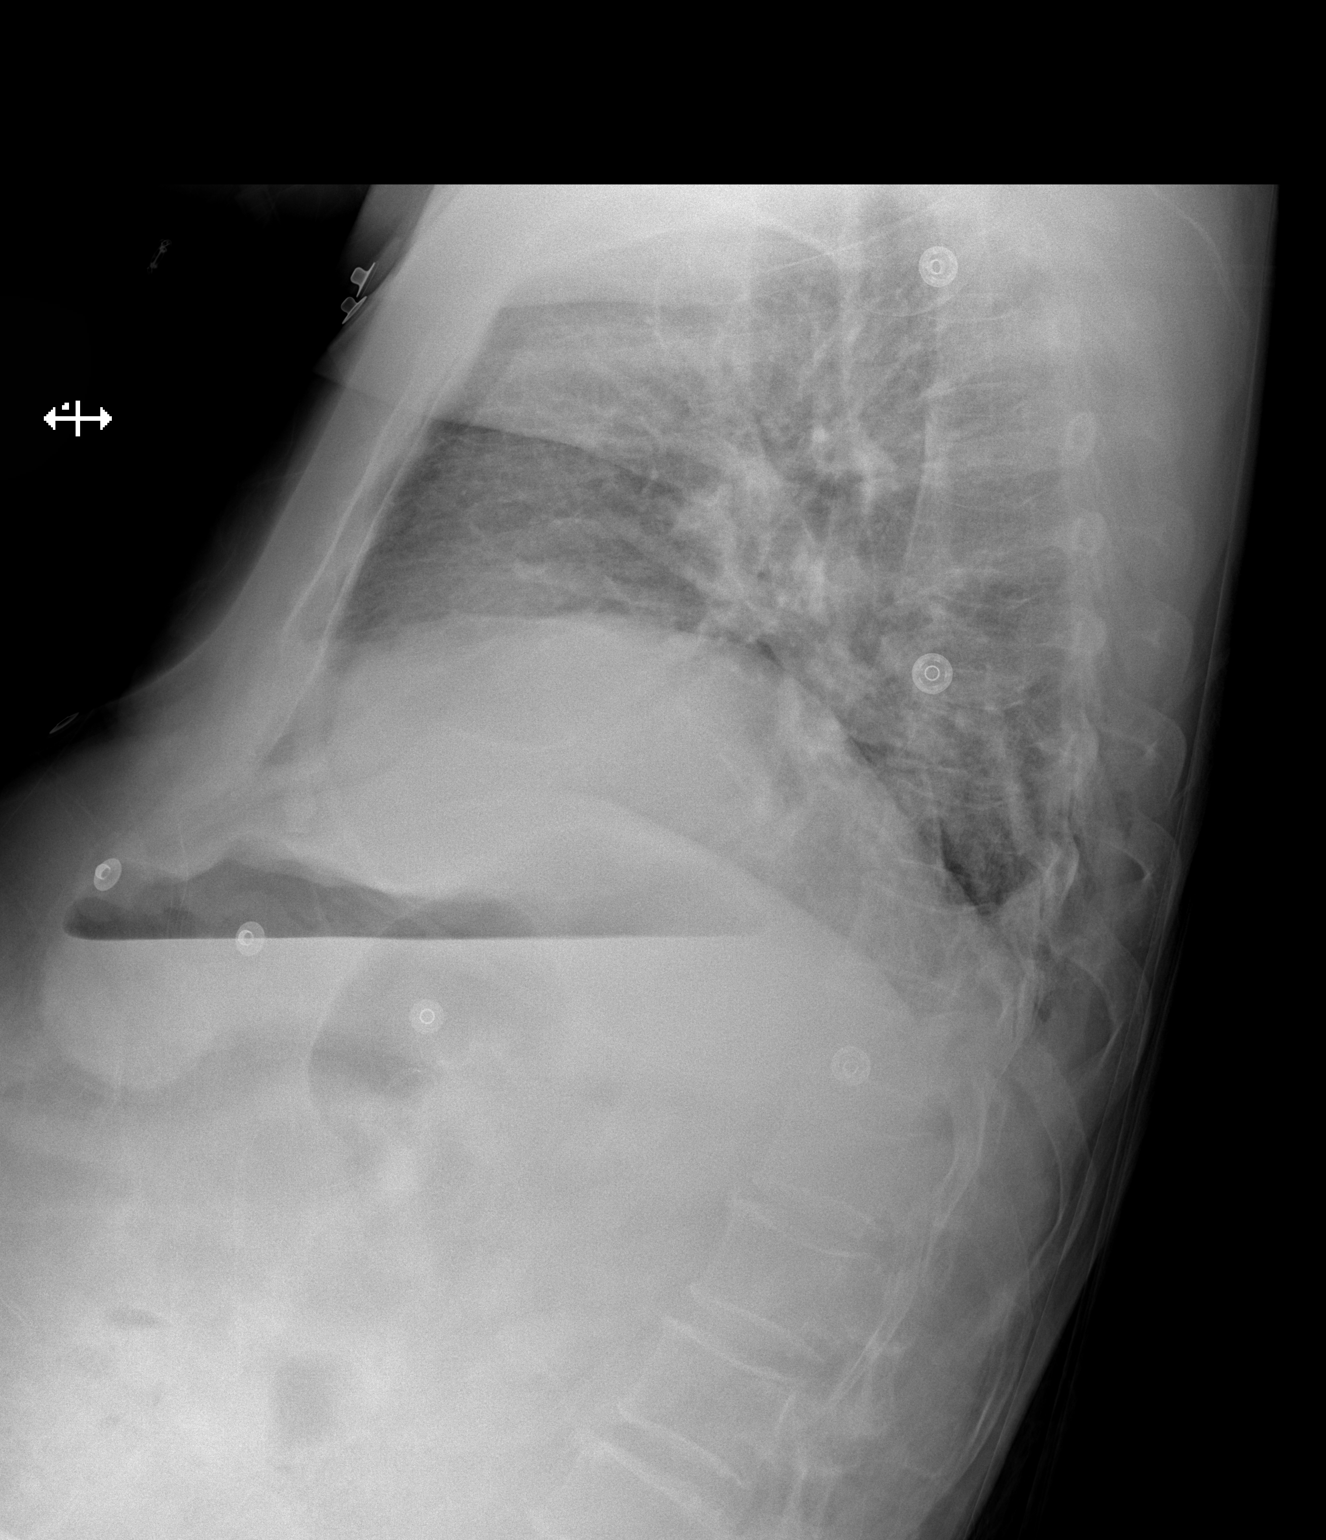

[2 of 2 positions shown; findings below may reference images not displayed]

FINDINGS: The lungs are hypoexpanded.  Bibasilar airspace opacities
raise concern for pneumonia, particularly on the left.  No pleural
effusion or pneumothorax is seen.

The cardiomediastinal silhouette is borderline enlarged.  No acute
osseous abnormalities are identified.
IMPRESSION: 1.  Lungs hypoexpanded.  Bibasilar airspace opacities raise concern
for pneumonia, particularly on the left.
2.  Borderline cardiomegaly.

## 2017-01-05 ENCOUNTER — Encounter: Payer: Self-pay | Admitting: Family Medicine

## 2017-01-05 ENCOUNTER — Ambulatory Visit (INDEPENDENT_AMBULATORY_CARE_PROVIDER_SITE_OTHER): Payer: BLUE CROSS/BLUE SHIELD | Admitting: Family Medicine

## 2017-01-05 VITALS — BP 124/72 | HR 104 | Temp 98.6°F | Resp 18 | Ht 63.0 in | Wt 158.6 lb

## 2017-01-05 DIAGNOSIS — I10 Essential (primary) hypertension: Secondary | ICD-10-CM | POA: Diagnosis not present

## 2017-01-05 DIAGNOSIS — Z7689 Persons encountering health services in other specified circumstances: Secondary | ICD-10-CM | POA: Diagnosis not present

## 2017-01-05 DIAGNOSIS — Z23 Encounter for immunization: Secondary | ICD-10-CM

## 2017-01-05 MED ORDER — AMLODIPINE BESYLATE 5 MG PO TABS: 5.0000 mg | ORAL_TABLET | Freq: Every day | ORAL | 1 refills | Status: DC

## 2017-01-05 NOTE — Progress Notes (Signed)
10/30/20182:44 PM  Todd [name] 1930/03/15, 83 y.o. male [id number]  Chief Complaint  Patient presents with  . New Patient (Initial Visit)    HPI:   Patient is a 83 y.o. male with past medical history significant for HTN who presents today to establish care and requesting refill of his medication.  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 Authorizing Provider  amLODipine (NORVASC) 5 MG tablet Take 1 tablet (5 mg total) by mouth daily. 01/05/17  Yes Myles Lipps, MD    No past medical history on file.  No past surgical history on file.  Social History  Substance Use Topics  . Smoking status: Former Games developer  . Smokeless tobacco: Former Neurosurgeon  . Alcohol use No    No family history on file.  Review of Systems  Constitutional: Negative for chills and fever.  Eyes: Negative for blurred vision and double vision.  Respiratory: Negative for cough and shortness of breath.   Cardiovascular: Negative for chest pain, palpitations and leg swelling.  Gastrointestinal: Negative for abdominal pain, constipation, diarrhea, nausea and vomiting.  Genitourinary: Negative for dysuria, frequency, hematuria and urgency.  Neurological: Positive for tingling and focal weakness.  Psychiatric/Behavioral: Negative for depression. The patient is not nervous/anxious.      OBJECTIVE:  Blood pressure 124/72, pulse (!) 104, temperature 98.6 F (37 C), temperature source  Oral, resp. rate 18, height 5\' 3"  (1.6 m), weight 158 lb 9.6 oz (71.9 kg), SpO2 96 %.  Physical Exam  Constitutional: He is oriented to person, place, and time and well-developed, well-nourished, and in no distress.  HENT:  Head: Normocephalic and atraumatic.  Right Ear: Hearing, tympanic membrane, external ear and ear canal normal.  Left Ear: Hearing, tympanic membrane, external ear and ear canal normal.  Mouth/Throat: Oropharynx is clear and moist. No oropharyngeal exudate.  Eyes: Pupils are equal, round, and reactive to light. Conjunctivae and EOM are normal.  Neck: Neck supple.  Cardiovascular: Normal rate and regular rhythm.  Exam reveals no gallop and no friction rub.   No murmur heard. Pulmonary/Chest: Effort normal and breath sounds normal. He has no wheezes. He has no rales.  Abdominal: Soft. Bowel sounds are normal. He exhibits no distension. There is no hepatosplenomegaly. There is no tenderness. A hernia is present. Hernia confirmed positive in the umbilical area and confirmed positive in the ventral area.  Lymphadenopathy:    He has no cervical adenopathy.  Neurological: He is alert and oriented to person, place, and time. Gait normal.  Skin: Skin is warm and dry.    ASSESSMENT and PLAN  1. Essential hypertension, benign At goal, cont current regime - amLODipine (NORVASC) 5 MG tablet; Take 1 tablet (5 mg total) by mouth daily.  2. Encounter to establish care PMH, PSH, meds, allergies, Fhx, Shx, reviewed with patient today.  3. Need for vaccination - Flu Vaccine QUAD 36+ mos IM given today    Return in  about 6 months (around 07/06/2017).    Myles LippsIrma M Santiago, MD [facility] at [facility] [address] [place], [place] [address] Ph.  [phone] Fax [phone]

## 2017-01-05 NOTE — Addendum Note (Signed)
Addended by: Myles LippsSANTIAGO, Martrell Eguia M on: 01/05/2017 03:38 PM   Modules accepted: Orders

## 2017-01-05 NOTE — Patient Instructions (Signed)
     IF you received an x-ray today, you will receive an invoice from [facility]. Please contact [facility] at [phone] with questions or concerns regarding your invoice.   IF you received labwork today, you will receive an invoice from [facility]. Please contact [facility] at [phone] with questions or concerns regarding your invoice.   Our billing staff will not be able to assist you with questions regarding bills from these companies.  You will be contacted with the lab results as soon as they are available. The fastest way to get your results is to activate your My Chart account. Instructions are located on the last page of this paperwork. If you have not heard from us regarding the results in 2 weeks, please contact this office.     

## 2017-01-06 LAB — CBC WITH DIFFERENTIAL/PLATELET
Basophils Absolute: 0.1 10*3/uL (ref 0.0–0.2)
Basos: 1 %
EOS (ABSOLUTE): 0.2 10*3/uL (ref 0.0–0.4)
Eos: 2 %
Hematocrit: 47 % (ref 37.5–51.0)
Hemoglobin: 15.7 g/dL (ref 13.0–17.7)
Immature Grans (Abs): 0 10*3/uL (ref 0.0–0.1)
Immature Granulocytes: 0 %
Lymphocytes Absolute: 1.8 10*3/uL (ref 0.7–3.1)
Lymphs: 23 %
MCH: 26.7 pg (ref 26.6–33.0)
MCHC: 33.4 g/dL (ref 31.5–35.7)
MCV: 80 fL (ref 79–97)
Monocytes Absolute: 0.5 10*3/uL (ref 0.1–0.9)
Monocytes: 7 %
Neutrophils Absolute: 5.3 10*3/uL (ref 1.4–7.0)
Neutrophils: 67 %
Platelets: 199 10*3/uL (ref 150–379)
RBC: 5.89 x10E6/uL — ABNORMAL HIGH (ref 4.14–5.80)
RDW: 15 % (ref 12.3–15.4)
WBC: 7.8 10*3/uL (ref 3.4–10.8)

## 2017-01-06 LAB — COMPREHENSIVE METABOLIC PANEL
ALT: 25 IU/L (ref 0–44)
AST: 27 IU/L (ref 0–40)
Albumin/Globulin Ratio: 1.4 (ref 1.2–2.2)
Albumin: 4.7 g/dL (ref 3.5–4.7)
Alkaline Phosphatase: 108 IU/L (ref 39–117)
BUN/Creatinine Ratio: 12 (ref 10–24)
BUN: 13 mg/dL (ref 8–27)
Bilirubin Total: 0.3 mg/dL (ref 0.0–1.2)
CO2: 26 mmol/L (ref 20–29)
Calcium: 9.6 mg/dL (ref 8.6–10.2)
Chloride: 101 mmol/L (ref 96–106)
Creatinine, Ser: 1.11 mg/dL (ref 0.76–1.27)
GFR calc Af Amer: 70 mL/min/{1.73_m2} (ref >59)
GFR calc non Af Amer: 61 mL/min/{1.73_m2} (ref >59)
Globulin, Total: 3.4 g/dL (ref 1.5–4.5)
Glucose: 100 mg/dL — ABNORMAL HIGH (ref 65–99)
Potassium: 4.3 mmol/L (ref 3.5–5.2)
Sodium: 143 mmol/L (ref 134–144)
Total Protein: 8.1 g/dL (ref 6.0–8.5)

## 2017-01-06 LAB — LIPID PANEL
Chol/HDL Ratio: 7.2 ratio — ABNORMAL HIGH (ref 0.0–5.0)
Cholesterol, Total: 230 mg/dL — ABNORMAL HIGH (ref 100–199)
HDL: 32 mg/dL — ABNORMAL LOW (ref >39)
LDL Calculated: 155 mg/dL — ABNORMAL HIGH (ref 0–99)
Triglycerides: 214 mg/dL — ABNORMAL HIGH (ref 0–149)
VLDL Cholesterol Cal: 43 mg/dL — ABNORMAL HIGH (ref 5–40)

## 2017-01-06 LAB — TSH: TSH: 3.91 u[IU]/mL (ref 0.450–4.500)

## 2017-07-23 ENCOUNTER — Encounter: Payer: Self-pay | Admitting: Family Medicine

## 2017-07-23 ENCOUNTER — Ambulatory Visit: Payer: BLUE CROSS/BLUE SHIELD | Admitting: Family Medicine

## 2017-07-23 ENCOUNTER — Other Ambulatory Visit: Payer: Self-pay

## 2017-07-23 VITALS — BP 120/74 | HR 89 | Temp 98.2°F | Ht 62.99 in | Wt 159.0 lb

## 2017-07-23 DIAGNOSIS — Z23 Encounter for immunization: Secondary | ICD-10-CM

## 2017-07-23 DIAGNOSIS — I10 Essential (primary) hypertension: Secondary | ICD-10-CM | POA: Diagnosis not present

## 2017-07-23 DIAGNOSIS — G5603 Carpal tunnel syndrome, bilateral upper limbs: Secondary | ICD-10-CM

## 2017-07-23 MED ORDER — AMLODIPINE BESYLATE 5 MG PO TABS: 5.0000 mg | ORAL_TABLET | Freq: Every day | ORAL | 1 refills | Status: DC

## 2017-07-23 NOTE — Progress Notes (Signed)
   5/17/201910:55 AM  [name] 1930/08/25, 84 y.o. male [id number]  Chief Complaint  Patient presents with  . Medication Refill    HPI:   Patient is a 84 y.o. male with past medical history significant for HTN who presents today requesting medication refill He reports takes meds as prescribed Denies any side effects Otherwise c/o intermittent bilateral numbness and tingling of 2nd and 3rd fingers of both hands Denies any weakness, not dropping objects  Fall Risk  07/23/2017 01/05/2017  Falls in the past year? No No     Depression screen [facility] 2/9 07/23/2017 01/05/2017  Decreased Interest 0 0  Down, Depressed, Hopeless 0 0  PHQ - 2 Score 0 0    No Known Allergies  Prior to Admission medications   Medication Sig Start Date End Date Taking? Authorizing Provider  amLODipine (NORVASC) 5 MG tablet Take 1 tablet (5 mg total) by mouth daily. 01/05/17  Yes Myles Lipps, MD    History reviewed. No pertinent past medical history.  History reviewed. No pertinent surgical history.  Social History   Tobacco Use  . Smoking status: Former Games developer  . Smokeless tobacco: Former Engineer, water Use Topics  . Alcohol use: No    History reviewed. No pertinent family history.  Review of Systems  Constitutional: Negative for chills and fever.  Respiratory: Negative for cough and shortness of breath.   Cardiovascular: Negative for chest pain, palpitations and leg swelling.  Gastrointestinal: Negative for abdominal pain, nausea and vomiting.     OBJECTIVE:  Blood pressure 120/74, pulse 89, temperature 98.2 F (36.8 C), temperature source Oral, height 5' 2.99" (1.6 m), weight 159 lb (72.1 kg), SpO2 96 %.  Physical Exam  Constitutional: He is oriented to person, place, and time. He appears well-developed and well-nourished.  HENT:  Head: Normocephalic and atraumatic.  Mouth/Throat: Oropharynx is clear and moist.  Eyes: Pupils are equal, round, and reactive to light.  Conjunctivae and EOM are normal.  Neck: Neck supple.  Cardiovascular: Normal rate and regular rhythm. Exam reveals no gallop and no friction rub.  No murmur heard. Pulmonary/Chest: Effort normal and breath sounds normal. He has no wheezes. He has no rales.  Musculoskeletal:       Right wrist: He exhibits normal range of motion, no tenderness and no swelling.       Left wrist: He exhibits normal range of motion, no tenderness and no swelling.  + tinel and phalen signs bilaterally  Neurological: He is alert and oriented to person, place, and time.  Skin: Skin is warm and dry.  Psychiatric: He has a normal mood and affect.  Nursing note and vitals reviewed.   ASSESSMENT and PLAN  1. Essential hypertension, benign Controlled, cont current regime  2. Need for vaccination  3. Bilateral carpal tunnel syndrome Discussed supportive measures, such as using an appropriate brace at bedtime. OTC nsaids use prn.  Patient educational handout given.  Other orders - Pneumococcal conjugate vaccine 13-valent IM - amLODipine (NORVASC) 5 MG tablet; Take 1 tablet (5 mg total) by mouth daily.  Return in about 6 months (around 01/23/2018).    Myles Lipps, MD Primary Care at [facility] [address] [place], [place] [address] Ph.  [phone] Fax [phone]

## 2017-07-23 NOTE — Patient Instructions (Addendum)
     IF you received an x-ray today, you will receive an invoice from [facility]. Please contact [facility] at [phone] with questions or concerns regarding your invoice.   IF you received labwork today, you will receive an invoice from [facility]. Please contact [facility] at [phone] with questions or concerns regarding your invoice.   Our billing staff will not be able to assist you with questions regarding bills from these companies.  You will be contacted with the lab results as soon as they are available. The fastest way to get your results is to activate your My Chart account. Instructions are located on the last page of this paperwork. If you have not heard from us regarding the results in 2 weeks, please contact this office.     Sndrome del tnel carpiano (Carpal Tunnel Syndrome) El sndrome del tnel carpiano es una afeccin que causa dolor en la mano y en el brazo. El tnel carpiano es un espacio estrecho ubicado en el lado palmar de la mueca. Los movimientos repetidos de la mueca o determinadas enfermedades pueden causar la hinchazn del tnel. Esta hinchazn puede comprimir el nervio principal de la mueca (nervio mediano). CUIDADOS EN EL HOGAR Si tiene una frula:  sela como se lo haya indicado el mdico. Qutesela solamente como se lo haya indicado el mdico.  Afloje la frula si los dedos: ? Se le adormecen y siente hormigueos. ? Se le ponen azulados y fros.  Mantenga la frula limpia y seca. Instrucciones generales  Tome los medicamentos de venta libre y los recetados solamente como se lo haya indicado el mdico.  Haga reposar la mueca de toda actividad que le cause dolor. Si es necesario, hable con su empleador sobre los cambios que pueden hacerse en su lugar de trabajo, por ejemplo, usar una almohadilla para apoyar la mueca mientras tipea.  Si se lo indican, aplique hielo sobre la zona dolorida: ? Ponga el hielo en una bolsa  plstica. ? Coloque una toalla entre la piel y la bolsa de hielo. ? Coloque el hielo durante 20minutos, 2 a 3veces por da.  Concurra a todas las visitas de control como se lo haya indicado el mdico. Esto es importante.  Haga los ejercicios como se lo hayan indicado el mdico, el fisioterapeuta o el terapeuta ocupacional. SOLICITE AYUDA SI:  Aparecen nuevos sntomas.  Los medicamentos no le alivian el dolor.  Los sntomas empeoran. Esta informacin no tiene como fin reemplazar el consejo del mdico. Asegrese de hacerle al mdico cualquier pregunta que tenga. Document Released: 02/12/2011 Document Revised: 11/14/2014 Document Reviewed: 07/11/2014 Elsevier Interactive Patient Education  2018 Elsevier Inc.  

## 2017-08-13 ENCOUNTER — Encounter: Payer: Self-pay | Admitting: Family Medicine

## 2017-10-01 ENCOUNTER — Ambulatory Visit: Payer: BLUE CROSS/BLUE SHIELD | Admitting: Urgent Care

## 2017-10-01 ENCOUNTER — Other Ambulatory Visit: Payer: Self-pay

## 2017-10-01 ENCOUNTER — Encounter: Payer: Self-pay | Admitting: Urgent Care

## 2017-10-01 VITALS — BP 130/68 | HR 89 | Temp 98.7°F | Resp 16 | Ht 62.99 in | Wt 156.8 lb

## 2017-10-01 DIAGNOSIS — R1310 Dysphagia, unspecified: Secondary | ICD-10-CM | POA: Diagnosis not present

## 2017-10-01 DIAGNOSIS — E782 Mixed hyperlipidemia: Secondary | ICD-10-CM | POA: Diagnosis not present

## 2017-10-01 DIAGNOSIS — R1111 Vomiting without nausea: Secondary | ICD-10-CM

## 2017-10-01 DIAGNOSIS — I10 Essential (primary) hypertension: Secondary | ICD-10-CM | POA: Diagnosis not present

## 2017-10-01 MED ORDER — ATORVASTATIN CALCIUM 10 MG PO TABS: 10.0000 mg | ORAL_TABLET | Freq: Every day | ORAL | 3 refills | Status: DC

## 2017-10-01 MED ORDER — AMLODIPINE BESYLATE 5 MG PO TABS: 5.0000 mg | ORAL_TABLET | Freq: Every day | ORAL | 1 refills | Status: DC

## 2017-10-01 MED ORDER — RANITIDINE HCL 150 MG PO TABS: 150.0000 mg | ORAL_TABLET | Freq: Two times a day (BID) | ORAL | 1 refills | Status: DC

## 2017-10-01 MED ORDER — OMEPRAZOLE 20 MG PO CPDR: 20.0000 mg | DELAYED_RELEASE_CAPSULE | Freq: Every day | ORAL | 3 refills | Status: DC

## 2017-10-01 NOTE — Patient Instructions (Addendum)
Disfagia (Dysphagia) La dificultad para tragar (disfagia) ocurre cuando los slidos y los lquidos parecen adherirse a Administratorla garganta en su paso hacia el [place], o los alimentos demoran mucho en llegar al Teachers Insurance and Annuity Associationestmago. Otros sntomas son regurgitacin de la comida, ruidos que provienen de la garganta, molestias en el pecho al tragar y sensacin de plenitud o de que hay algo adherido en la garganta al tragar. Cuando la obstruccin en la garganta es completa puede asociarse a babeo. CAUSAS Los problemas para tragar pueden ocurrir debido a Estée Lauderproblemas en los msculos. Los alimentos no pueden ser Reynolds Americanempujados de la manera habitual hacia el [place]estmago. Puede tener lceras, tejido cicatrizal o inflamacin en el tubo por el que los alimentos descienden desde la boca al estmago (esfago), lo que obstruye a los alimentos en su paso normal hacia el [place]. Las causas de la inflamacin incluyen:  Reflujo cido desde el estmago hacia el esfago.  Infeccin.  Tratamiento de radiacin para Management consultantel cncer.  Medicamentos que se toman sin la cantidad suficiente de lquido para Hydrologistempujarlos hacia el estmago. Puede ser que tenga problemas neurolgicos que impidan que las seales se enven a los msculos del esfago para que se contraigan y [place] Westmuevan la comida Brinsmadehacia abajo, Lillianhacia el estmago. Un globo farngeo es un problema relativamente frecuente en el que hay una sensacin de obstruccin o dificultad para tragar, sin que haya ninguna anormalidad fsica en los conductos que intervienen en la deglucin. Este problema generalmente mejora con el tiempo, al reasegurar y [place]efectuar estudios para descartar otras causas. DIAGNSTICO La disfagia puede diagnosticarse y su causa puede determinarse con estudios en los que deber tragar una sustancia blanca que ayuda visualizar el interior de la garganta (medio de contraste) mientras se toman radiografas. En algunos casos se inserta un telescopio flexible (endoscopio) para observar el esfago y Water quality scientistel  estmago. TRATAMIENTO  Si la causa de la disfagia es el reflujo cido o una infeccin podrn indicarle medicamentos.  Si la causa de la disfagia son problemas en los msculos que intervienen en la deglucin, ser necesario seguir una terapia para fortalecer estos msculos.  Si la causa es una obstruccin o un bulto, se realizarn procedimientos para remover la obstruccin.  INSTRUCCIONES PARA EL CUIDADO EN EL HOGAR  Trate de consumir alimentos que sean fciles de tragar y trate de controlar su peso diariamente para asegurarse de que no baje demasiado.  Asegrese de beber lquidos mientras se encuentre sentado erguido (no acostado).  SOLICITE ATENCIN MDICA SI:  Pierde peso debido a que no puede tragar.  Tose al beber lquidos (aspiracin).  Tose y elimina comida parcialmente digerida.  SOLICITE ATENCIN MDICA DE INMEDIATO SI:  No puede tragar su propia saliva.  Tiene dificultad para respirar, tiene fiebre o ambos.  Tiene ronquera junto a la dificultad para tragar.  ASEGRESE DE QUE:  Comprende estas instrucciones.  Controlar su afeccin.  Recibir ayuda de inmediato si no mejora o si empeora.  Esta informacin no tiene Theme park managercomo fin reemplazar el consejo del mdico. Asegrese de hacerle al mdico cualquier pregunta que tenga. Document Released: 12/03/2004 Document Revised: 10/26/2012 Document Reviewed: 08/12/2012 Elsevier Interactive Patient Education  2017 [employer]     IF you received an x-ray today, you will receive an invoice from [facility]. Please contact [facility] at [phone] with questions or concerns regarding your invoice.   IF you received labwork today, you will receive an invoice from [facility]. Please contact [facility] at [phone] with questions or concerns regarding your invoice.   Our billing  staff will not be able to assist you with questions regarding bills from these companies.  You will be contacted with the lab  results as soon as they are available. The fastest way to get your results is to activate your My Chart account. Instructions are located on the last page of this paperwork. If you have not heard from [place] regarding the results in 2 weeks, please contact this office.

## 2017-10-01 NOTE — Progress Notes (Signed)
    MRN: [id number] DOB: 01/27/1931  Subjective:   Todd [name] is a 84 y.o. male presenting for follow up on Hypertension.   Currently managed with amlodipine 5mg .  Avoids salt in diet.  Reports about a 1 month history of food coming back up and exiting through his nose.  He does report that he feels heartburn type sensation in epigastric area and also in mid sternum and is very consistently associated with eating as his food can readily come back up.  He has never had any GI issues before and very actively denies chest pain.  Today, he is requesting reflux medicine to help address this as the source of his symptoms.  Denies dizziness, headache, shortness of breath, heart racing, palpitations, abdominal pain, hematuria, lower leg swelling. Denies smoking cigarettes or drinking alcohol.   Todd [name] has a current medication list which includes the following prescription(s): amlodipine. Also has No Known Allergies.  Todd [name]  has a past medical history of Hypertension. Denies past surgical history.  Objective:   Vitals: BP 130/68   Pulse 89   Temp 98.7 F (37.1 C)   Resp 16   Ht 5' 2.99" (1.6 m)   Wt 156 lb 12.8 oz (71.1 kg)   SpO2 99%   BMI 27.78 kg/m   Physical Exam  Constitutional: He is oriented to person, place, and time. He appears well-developed and well-nourished.  HENT:  Mouth/Throat: Oropharynx is clear and moist.  Eyes: Pupils are equal, round, and reactive to light. EOM are normal. Right eye exhibits no discharge. Left eye exhibits no discharge. No scleral icterus.  Neck: Normal range of motion. Neck supple. No JVD present. No thyromegaly present.  Cardiovascular: Normal rate, regular rhythm, normal heart sounds and intact distal pulses. Exam reveals no gallop and no friction rub.  No murmur heard. Pulmonary/Chest: Effort normal and breath sounds normal. No stridor. No respiratory distress. He has no wheezes. He has no rales.  Abdominal: Soft. Bowel sounds are  normal. He exhibits no distension and no mass. There is no tenderness. There is no rebound and no guarding.  Musculoskeletal: He exhibits no edema.  Neurological: He is alert and oriented to person, place, and time.  Skin: Skin is warm and dry.  Psychiatric: He has a normal mood and affect.   Assessment and Plan :   Essential hypertension, benign - Plan: Basic metabolic panel  Dysphagia, unspecified type - Plan: Ambulatory referral to Gastroenterology  Non-intractable vomiting without nausea, unspecified vomiting type - Plan: Ambulatory referral to Gastroenterology  Mixed hyperlipidemia  Refilled his amlodipine at 5 mg, start atorvastatin for mixed hyperlipidemia and heart protection.  His vomiting symptoms are very concerning, referral to gastroenterology is pending.  In the meantime I obliged patient and prescribed Prilosec and Zantac to help address his concern for reflux.  Return to clinic precautions reviewed.  Wallis BambergMario Jerrian Mells, PA-C Primary [facility] at [facility] [phone] 10/01/2017  3:36 PM

## 2017-10-02 LAB — BASIC METABOLIC PANEL
BUN/Creatinine Ratio: 8 — ABNORMAL LOW (ref 10–24)
BUN: 9 mg/dL (ref 8–27)
CALCIUM: 8.9 mg/dL (ref 8.6–10.2)
CO2: 22 mmol/L (ref 20–29)
Chloride: 99 mmol/L (ref 96–106)
Creatinine, Ser: 1.06 mg/dL (ref 0.76–1.27)
GFR calc Af Amer: 74 mL/min/{1.73_m2} (ref >59)
GFR, EST NON AFRICAN AMERICAN: 64 mL/min/{1.73_m2} (ref >59)
Glucose: 120 mg/dL — ABNORMAL HIGH (ref 65–99)
POTASSIUM: 4 mmol/L (ref 3.5–5.2)
Sodium: 140 mmol/L (ref 134–144)

## 2017-10-07 ENCOUNTER — Encounter: Payer: Self-pay | Admitting: Gastroenterology

## 2017-10-15 ENCOUNTER — Telehealth: Payer: Self-pay | Admitting: Family Medicine

## 2017-10-15 NOTE — Telephone Encounter (Signed)
Called Todd [name] to let them know we would need to reschedule their appt 11/09/17. Todd [name]. Rescheduled

## 2017-11-03 ENCOUNTER — Ambulatory Visit: Payer: Self-pay | Admitting: Gastroenterology

## 2017-11-09 ENCOUNTER — Ambulatory Visit: Payer: BLUE CROSS/BLUE SHIELD | Admitting: Urgent Care

## 2017-11-09 ENCOUNTER — Encounter: Payer: Self-pay | Admitting: Urgent Care

## 2017-11-10 ENCOUNTER — Ambulatory Visit: Payer: BLUE CROSS/BLUE SHIELD | Admitting: Urgent Care

## 2020-03-21 ENCOUNTER — Other Ambulatory Visit: Payer: Self-pay

## 2020-03-21 ENCOUNTER — Ambulatory Visit
Admission: EM | Admit: 2020-03-21 | Discharge: 2020-03-21 | Disposition: A | Discharge status: Home / Self Care | Payer: 59 | Attending: Emergency Medicine | Admitting: Emergency Medicine

## 2020-03-21 DIAGNOSIS — R059 Cough, unspecified: Secondary | ICD-10-CM

## 2020-03-21 DIAGNOSIS — J22 Unspecified acute lower respiratory infection: Secondary | ICD-10-CM | POA: Diagnosis not present

## 2020-03-21 MED ORDER — DM-GUAIFENESIN ER 30-600 MG PO TB12: 1.0000 | ORAL_TABLET | Freq: Two times a day (BID) | ORAL | 0 refills | Status: DC

## 2020-03-21 MED ORDER — DOXYCYCLINE HYCLATE 100 MG PO CAPS
100.0000 mg | ORAL_CAPSULE | Freq: Two times a day (BID) | ORAL | 0 refills | Status: AC
Start: 2020-03-21 — End: 2020-03-28

## 2020-03-21 MED ORDER — BENZONATATE 200 MG PO CAPS
200.0000 mg | ORAL_CAPSULE | Freq: Three times a day (TID) | ORAL | 0 refills | Status: AC | PRN
Start: 2020-03-21 — End: 2020-03-28

## 2020-03-21 NOTE — ED Triage Notes (Signed)
Patient presents to Urgent Care with c/o cough for several days. He is vaccinated for covid. Patient reports otc cold medication with no improvement.

## 2020-03-21 NOTE — Discharge Instructions (Signed)
COVID test pending, monitor MyChart for results Begin doxycycline twice daily for 1 week Tessalon every 8 hours for cough Mucinex DM to further help with cough and congestion Rest and fluids Ibuprofen and Tylenol as needed Follow-up if not improving or worsening

## 2020-03-21 NOTE — ED Provider Notes (Signed)
[facility]    CSN: [id number] Arrival date & time: 03/21/20  0915      History   Chief Complaint Chief Complaint  Patient presents with  . Cough    HPI Todd [name] is a 87 y.o. male history of hypertension.  Presenting to the today for evaluation of cough.  Reports he has had cough over the past 8 days. Denies sore throat or nasal congestion.  Denies fever. Has had COVID vaccination.  Using over-the-counter medicine without relief. Denies history of lung problems.  Spouse here with similar symptoms.  HPI  Past Medical History:  Diagnosis Date  . Hypertension     Patient Active Problem List   Diagnosis Date Noted  . Unspecified essential hypertension 09/22/2012  . Cardiomegaly 09/21/2012  . CAP (community acquired pneumonia) 09/19/2012  . Abdominal pain, acute, generalized 09/19/2012  . Nausea & vomiting 09/19/2012  . Elevated BP 09/19/2012  . Dehydration, mild 09/19/2012    History reviewed. No pertinent surgical history.     Home Medications    Prior to Admission medications   Medication Sig Start Date End Date Taking? Authorizing Provider  benzonatate (TESSALON) 200 MG capsule Take 1 capsule (200 mg total) by mouth 3 (three) times daily as needed for up to 7 days for cough. 03/21/20 03/28/20 Yes Wieters, Hallie C, PA-C  dextromethorphan-guaiFENesin (MUCINEX DM) 30-600 MG 12hr tablet Take 1 tablet by mouth 2 (two) times daily. 03/21/20  Yes Wieters, Hallie C, PA-C  doxycycline (VIBRAMYCIN) 100 MG capsule Take 1 capsule (100 mg total) by mouth 2 (two) times daily for 7 days. 03/21/20 03/28/20 Yes Wieters, Hallie C, PA-C  amLODipine (NORVASC) 5 MG tablet Take 1 tablet (5 mg total) by mouth daily. 10/01/17   Wallis Bamberg, PA-C  atorvastatin (LIPITOR) 10 MG tablet Take 1 tablet (10 mg total) by mouth daily. 10/01/17   Wallis Bamberg, PA-C  omeprazole (PRILOSEC) 20 MG capsule Take 1 capsule (20 mg total) by mouth daily. 10/01/17   Wallis Bamberg, PA-C  ranitidine  (ZANTAC) 150 MG tablet Take 1 tablet (150 mg total) by mouth 2 (two) times daily. 10/01/17   Wallis Bamberg, PA-C    Family History History reviewed. No pertinent family history.  Social History Social History   Tobacco Use  . Smoking status: Former Games developer  . Smokeless tobacco: Former Engineer, water Use Topics  . Alcohol use: No  . Drug use: No     Allergies   Patient has no known allergies.   Review of Systems Review of Systems  Constitutional: Negative for activity change, appetite change, chills, fatigue and fever.  HENT: Negative for congestion, ear pain, rhinorrhea, sinus pressure, sore throat and trouble swallowing.   Eyes: Negative for discharge and redness.  Respiratory: Positive for cough. Negative for chest tightness and shortness of breath.   Cardiovascular: Negative for chest pain.  Gastrointestinal: Negative for abdominal pain, diarrhea, nausea and vomiting.  Musculoskeletal: Negative for myalgias.  Skin: Negative for rash.  Neurological: Negative for dizziness, light-headedness and headaches.     Physical Exam Triage Vital Signs ED Triage Vitals  Enc Vitals Group     BP 03/21/20 1005 (!) 196/92     Pulse Rate 03/21/20 1005 67     Resp 03/21/20 1005 16     Temp 03/21/20 1005 98 F (36.7 C)     Temp Source 03/21/20 1005 Oral     SpO2 03/21/20 1005 94 %     Weight --  Height --      Head Circumference --      Peak Flow --      Pain Score 03/21/20 1007 0     Pain Loc --      Pain Edu? --      Excl. in GC? --    No data found.  Updated Vital Signs BP (!) 196/92 (BP Location: Left Arm)   Pulse 67   Temp 98 F (36.7 C) (Oral)   Resp 16   SpO2 94%   Visual Acuity Right Eye Distance:   Left Eye Distance:   Bilateral Distance:    Right Eye Near:   Left Eye Near:    Bilateral Near:     Physical Exam Vitals and nursing note reviewed.  Constitutional:      Appearance: He is well-developed and well-nourished.     Comments: No acute  distress  HENT:     Head: Normocephalic and atraumatic.     Ears:     Comments: Bilateral ears without tenderness to palpation of external auricle, tragus and mastoid, EAC's without erythema or swelling, TM's with good bony landmarks and cone of light. Non erythematous.     Nose: Nose normal.     Mouth/Throat:     Comments: Oral mucosa pink and moist, no tonsillar enlargement or exudate. Posterior pharynx patent and nonerythematous, no uvula deviation or swelling. Normal phonation. Eyes:     Conjunctiva/sclera: Conjunctivae normal.  Cardiovascular:     Rate and Rhythm: Normal rate.  Pulmonary:     Effort: Pulmonary effort is normal. No respiratory distress.     Comments: Breathing comfortably at rest, CTABL, no wheezing, rales or other adventitious sounds auscultated Abdominal:     General: There is no distension.  Musculoskeletal:        General: Normal range of motion.     Cervical back: Neck supple.  Skin:    General: Skin is warm and dry.  Neurological:     Mental Status: He is alert and oriented to person, place, and time.  Psychiatric:        Mood and Affect: Mood and affect normal.      UC Treatments / Results  Labs (all labs ordered are listed, but only abnormal results are displayed) Labs Reviewed  NOVEL CORONAVIRUS, NAA    EKG   Radiology No results found.  Procedures Procedures (including critical care time)  Medications Ordered in UC Medications - No data to display  Initial Impression / Assessment and Plan / UC Course  I have reviewed the triage vital signs and the nursing notes.  Pertinent labs & imaging results that were available during my care of the patient were reviewed by me and considered in my medical decision making (see chart for details).     COVID is pending, given patient's age as well as length of symptoms treating for lower respiratory infection with doxycycline, Tessalon and Mucinex.  Continue to monitor symptoms and  breathing.  Discussed strict return precautions. Patient verbalized understanding and is agreeable with plan.  Final Clinical Impressions(s) / UC Diagnoses   Final diagnoses:  Cough  Lower respiratory infection (e.g., bronchitis, pneumonia, pneumonitis, pulmonitis)     Discharge Instructions     COVID test pending, monitor MyChart for results Begin doxycycline twice daily for 1 week Tessalon every 8 hours for cough Mucinex DM to further help with cough and congestion Rest and fluids Ibuprofen and Tylenol as needed Follow-up if not improving or worsening  ED Prescriptions    Medication Sig Dispense Auth. Provider   doxycycline (VIBRAMYCIN) 100 MG capsule Take 1 capsule (100 mg total) by mouth 2 (two) times daily for 7 days. 14 capsule Wieters, Hallie C, PA-C   benzonatate (TESSALON) 200 MG capsule Take 1 capsule (200 mg total) by mouth 3 (three) times daily as needed for up to 7 days for cough. 28 capsule Wieters, Hallie C, PA-C   dextromethorphan-guaiFENesin (MUCINEX DM) 30-600 MG 12hr tablet Take 1 tablet by mouth 2 (two) times daily. 20 tablet Wieters, [place] C, PA-C     PDMP not reviewed this encounter.   Lew Dawes, PA-C 03/21/20 1128

## 2020-03-21 NOTE — ED Notes (Signed)
Pt presents with cough for several days. He is vaccinated for covid w/o booster. Of note pt is hypertensive this am and reports taking his medication this am.

## 2020-03-23 LAB — SARS-COV-2, NAA 2 DAY TAT

## 2020-03-23 LAB — NOVEL CORONAVIRUS, NAA: SARS-CoV-2, NAA: NOT DETECTED

## 2020-09-17 ENCOUNTER — Ambulatory Visit (INDEPENDENT_AMBULATORY_CARE_PROVIDER_SITE_OTHER): Payer: 59 | Admitting: Family Medicine

## 2020-09-17 ENCOUNTER — Encounter: Payer: Self-pay | Admitting: Family Medicine

## 2020-09-17 ENCOUNTER — Other Ambulatory Visit: Payer: Self-pay

## 2020-09-17 VITALS — BP 152/92 | HR 74 | Temp 97.8°F | Ht 63.0 in | Wt 161.4 lb

## 2020-09-17 DIAGNOSIS — I10 Essential (primary) hypertension: Secondary | ICD-10-CM

## 2020-09-17 DIAGNOSIS — E785 Hyperlipidemia, unspecified: Secondary | ICD-10-CM

## 2020-09-17 LAB — LIPID PANEL
Cholesterol: 176 mg/dL (ref <200)
HDL: 28 mg/dL — ABNORMAL LOW (ref >=40)
LDL Cholesterol (Calc): 116 mg/dL (calc) — ABNORMAL HIGH
Non-HDL Cholesterol (Calc): 148 mg/dL (calc) — ABNORMAL HIGH (ref <130)
Total CHOL/HDL Ratio: 6.3 (calc) — ABNORMAL HIGH (ref <5.0)
Triglycerides: 204 mg/dL — ABNORMAL HIGH (ref <150)

## 2020-09-17 LAB — COMPREHENSIVE METABOLIC PANEL
AG Ratio: 1.2 (calc) (ref 1.0–2.5)
ALT: 11 U/L (ref 9–46)
AST: 15 U/L (ref 10–35)
Albumin: 4.1 g/dL (ref 3.6–5.1)
Alkaline phosphatase (APISO): 91 U/L (ref 35–144)
BUN: 12 mg/dL (ref 7–25)
CO2: 25 mmol/L (ref 20–32)
Calcium: 8.7 mg/dL (ref 8.6–10.3)
Chloride: 105 mmol/L (ref 98–110)
Creat: 1.06 mg/dL (ref 0.70–1.22)
Globulin: 3.5 g/dL (calc) (ref 1.9–3.7)
Glucose, Bld: 103 mg/dL — ABNORMAL HIGH (ref 65–99)
Potassium: 3.9 mmol/L (ref 3.5–5.3)
Sodium: 139 mmol/L (ref 135–146)
Total Bilirubin: 0.4 mg/dL (ref 0.2–1.2)
Total Protein: 7.6 g/dL (ref 6.1–8.1)

## 2020-09-17 NOTE — Patient Instructions (Signed)
C Continue same meds After blood work returns, we may add pill for arm/shoulder pain

## 2020-09-17 NOTE — Progress Notes (Signed)
Provider:  Jacalyn Lefevre, MD  Careteam: Patient Care Team: Frederica Kuster, MD as PCP - General (Family Medicine)  PLACE OF SERVICE:  [facility]  Advanced Directive information    No Known Allergies  Chief Complaint  Patient presents with   New Patient (Initial Visit)    Patient presents today for a new patient appointment.     HPI: Patient is a 87 y.o. male first visit for this 87 year old Hispanic gentleman.  He is accompanied by her nephew who serves as the Nurse, learning disability. His only medical issues are hypertension and hyperlipidemia.  Apparently had a stroke and possibly MI about 10 years ago.  He describes some clumsiness with his right upper extremity No recent issues with anorexia, weight loss, change in bowel or bladder habits, or cognitive decline Review of Systems:  Review of Systems  All other systems reviewed and are negative.  Past Medical History:  Diagnosis Date   GERD (gastroesophageal reflux disease)    Hyperlipidemia    Hypertension    History reviewed. No pertinent surgical history. Social History:   reports that he has quit smoking. His smoking use included cigarettes. He has never used smokeless tobacco. He reports that he does not drink alcohol and does not use drugs.  Family History  Problem Relation Age of Onset   Arthritis Mother    Heart disease Brother    Heart disease Brother    Heart disease Brother    Heart disease Brother    Heart disease Brother    Hyperlipidemia Son     Medications: Patient's Medications  New Prescriptions   No medications on file  Previous Medications   AMLODIPINE (NORVASC) 5 MG TABLET    Take 1 tablet (5 mg total) by mouth daily.   ATORVASTATIN (LIPITOR) 10 MG TABLET    Take 1 tablet (10 mg total) by mouth daily.   DEXTROMETHORPHAN-GUAIFENESIN (MUCINEX DM) 30-600 MG 12HR TABLET    Take 1 tablet by mouth 2 (two) times daily.   OMEPRAZOLE (PRILOSEC) 20 MG CAPSULE    Take 1 capsule (20 mg total) by mouth  daily.   RANITIDINE (ZANTAC) 150 MG TABLET    Take 1 tablet (150 mg total) by mouth 2 (two) times daily.  Modified Medications   No medications on file  Discontinued Medications   No medications on file    Physical Exam:  Vitals:   09/17/20 0811  BP: (!) 152/92  Pulse: 74  Temp: 97.8 F (36.6 C)  TempSrc: Temporal  SpO2: 98%  Weight: 161 lb 6.4 oz (73.2 kg)  Height: 5\' 3"  (1.6 m)   Body mass index is 28.59 kg/m. Wt Readings from Last 3 Encounters:  09/17/20 161 lb 6.4 oz (73.2 kg)  10/01/17 156 lb 12.8 oz (71.1 kg)  07/23/17 159 lb (72.1 kg)    Physical Exam Vitals and nursing note reviewed.  Constitutional:      Appearance: Normal appearance.  HENT:     Head: Normocephalic.     Right Ear: Tympanic membrane normal.     Left Ear: Tympanic membrane normal.  Eyes:     Extraocular Movements: Extraocular movements intact.     Pupils: Pupils are equal, round, and reactive to light.  Cardiovascular:     Rate and Rhythm: Normal rate and regular rhythm.  Pulmonary:     Effort: Pulmonary effort is normal.     Breath sounds: Normal breath sounds.  Abdominal:     General: Abdomen is flat. Bowel sounds  are normal.     Palpations: Abdomen is soft.  Musculoskeletal:        General: Normal range of motion.  Skin:    General: Skin is warm.  Neurological:     General: No focal deficit present.     Mental Status: He is alert and oriented to person, place, and time.  Psychiatric:        Mood and Affect: Mood normal.        Behavior: Behavior normal.        Thought Content: Thought content normal.        Judgment: Judgment normal.    Labs reviewed: Basic Metabolic Panel: No results for input(s): NA, K, CL, CO2, GLUCOSE, BUN, CREATININE, CALCIUM, MG, PHOS, TSH in the last 8760 hours. Liver Function Tests: No results for input(s): AST, ALT, ALKPHOS, BILITOT, PROT, ALBUMIN in the last 8760 hours. No results for input(s): LIPASE, AMYLASE in the last 8760 hours. No results  for input(s): AMMONIA in the last 8760 hours. CBC: No results for input(s): WBC, NEUTROABS, HGB, HCT, MCV, PLT in the last 8760 hours. Lipid Panel: No results for input(s): CHOL, HDL, LDLCALC, TRIG, CHOLHDL, LDLDIRECT in the last 8760 hours. TSH: No results for input(s): TSH in the last 8760 hours. A1C: No results found for: HGBA1C   Assessment/Plan  1. Hyperlipidemia, unspecified hyperlipidemia type Patient takes atorvastatin 10 mg.  Last LDL was 4 years ago and was 155 so not at goal. - Lipid Panel  2. Hypertension, unspecified type Blood pressure today 152/92 on amlodipine 5 mg - CMP    Jacalyn Lefevre, MD [facility] [phone]

## 2020-10-14 ENCOUNTER — Other Ambulatory Visit: Payer: Self-pay

## 2020-10-14 ENCOUNTER — Ambulatory Visit
Admission: EM | Admit: 2020-10-14 | Discharge: 2020-10-14 | Disposition: A | Discharge status: Home / Self Care | Payer: 59

## 2020-10-14 DIAGNOSIS — R059 Cough, unspecified: Secondary | ICD-10-CM | POA: Diagnosis not present

## 2020-10-14 DIAGNOSIS — I10 Essential (primary) hypertension: Secondary | ICD-10-CM | POA: Diagnosis not present

## 2020-10-14 MED ORDER — GUAIFENESIN-DM 100-10 MG/5ML PO SYRP: 5.0000 mL | ORAL_SOLUTION | ORAL | 0 refills | Status: DC | PRN

## 2020-10-14 MED ORDER — BENZONATATE 100 MG PO CAPS: 100.0000 mg | ORAL_CAPSULE | Freq: Three times a day (TID) | ORAL | 0 refills | Status: DC

## 2020-10-14 MED ORDER — OLMESARTAN MEDOXOMIL 40 MG PO TABS: 40.0000 mg | ORAL_TABLET | Freq: Every day | ORAL | 1 refills | Status: DC

## 2020-10-14 NOTE — ED Triage Notes (Signed)
One month h/o cough that has worsened in the last 3 days presenting with a new fatigue and intermittent sob. 8-10 days of congestion and sore throat. Has been taking OTC meds without relief.   Pt needs BP med refill.

## 2020-10-14 NOTE — Discharge Instructions (Addendum)
Can use tessalon pills every 8 hours as needed to help with cough   Can use 29mL cough syrup every 4 hours as needed   Continue to take pepcid daily  Continue to take olmesartan every morning, you have been ordered one month supply of medication for blood pressure and one refill, in two months you will run out of medication. Call Dr. Rondel Baton office so that he can refill your medication for 6 months to a year.

## 2020-10-14 NOTE — ED Provider Notes (Addendum)
[facility]    CSN: [id number] Arrival date & time: 10/14/20  1521      History   Chief Complaint Chief Complaint  Patient presents with   Cough   Fatigue    HPI Todd [name] is a 87 y.o. male.   Patient presents with productive cough for 10-15 days that has worsened over the last 3 days. Endorses nasal congestion, chest soreness, shortness of breath only after eating and sore throat, painful to swallow in addition. Denies fever, chills,  body aches, headache, ear pain, chest pain, dyspnea with exertion or rest, wheezing, heart burn , indigestion, increased gas production, bloating. No known sick contact. Vaccinated. Using otc cold/flu medication with no relief. History of GERD, HTN, HLD.   Requesting blood pressure medication refill. Attest to medication use daily as prescribed. Has not taken dose today. Denies headache, shortness  of breath with exertion or rest, chest pain or tightness, nausea, dizziness, lightheadedness, blurred vision. Saw PCP last month.   Past Medical History:  Diagnosis Date   GERD (gastroesophageal reflux disease)    Hyperlipidemia    Hypertension     Patient Active Problem List   Diagnosis Date Noted   Hyperlipidemia    Essential hypertension 09/22/2012   Cardiomegaly 09/21/2012   CAP (community acquired pneumonia) 09/19/2012   Abdominal pain, acute, generalized 09/19/2012   Nausea & vomiting 09/19/2012   Hypertension associated with diabetes (HCC) 09/19/2012   Dehydration, mild 09/19/2012    History reviewed. No pertinent surgical history.     Home Medications    Prior to Admission medications   Medication Sig Start Date End Date Taking? Authorizing Provider  benzonatate (TESSALON) 100 MG capsule Take 1 capsule (100 mg total) by mouth every 8 (eight) hours. 10/14/20  Yes Adeena Bernabe R, NP  guaiFENesin-dextromethorphan (ROBITUSSIN DM) 100-10 MG/5ML syrup Take 5 mLs by mouth every 4 (four) hours as needed for cough.  10/14/20  Yes Najma Bozarth R, NP  amLODipine (NORVASC) 5 MG tablet Take 1 tablet (5 mg total) by mouth daily. 10/01/17   Wallis Bamberg, PA-C  atorvastatin (LIPITOR) 10 MG tablet Take 1 tablet (10 mg total) by mouth daily. 10/01/17   Wallis Bamberg, PA-C  famotidine (PEPCID) 40 MG tablet Take 40 mg by mouth daily. 09/04/20   [provider]  olmesartan (BENICAR) 40 MG tablet Take 1 tablet (40 mg total) by mouth daily. 10/14/20   Valinda Hoar, NP  omeprazole (PRILOSEC) 20 MG capsule Take 1 capsule (20 mg total) by mouth daily. 10/01/17   Wallis Bamberg, PA-C  ranitidine (ZANTAC) 150 MG tablet Take 1 tablet (150 mg total) by mouth 2 (two) times daily. 10/01/17   Wallis Bamberg, PA-C    Family History Family History  Problem Relation Age of Onset   Arthritis Mother    Heart disease Brother    Heart disease Brother    Heart disease Brother    Heart disease Brother    Heart disease Brother    Hyperlipidemia Son     Social History Social History   Tobacco Use   Smoking status: Former    Years: 20.00    Types: Cigarettes   Smokeless tobacco: Never  Vaping Use   Vaping Use: Never used  Substance Use Topics   Alcohol use: Never   Drug use: Never     Allergies   Patient has no known allergies.   Review of Systems Review of Systems Defer to HPI    Physical Exam Triage Vital  Signs ED Triage Vitals  Enc Vitals Group     BP 10/14/20 1534 (!) 158/84     Pulse Rate 10/14/20 1534 85     Resp 10/14/20 1534 18     Temp 10/14/20 1534 98.4 F (36.9 C)     Temp Source 10/14/20 1534 Oral     SpO2 10/14/20 1534 95 %     Weight --      Height --      Head Circumference --      Peak Flow --      Pain Score 10/14/20 1538 8     Pain Loc --      Pain Edu? --      Excl. in GC? --    No data found.  Updated Vital Signs BP (!) 158/84 (BP Location: Left Arm)   Pulse 85   Temp 98.4 F (36.9 C) (Oral)   Resp 18   SpO2 95%   Visual Acuity Right Eye Distance:   Left Eye  Distance:   Bilateral Distance:    Right Eye Near:   Left Eye Near:    Bilateral Near:     Physical Exam Constitutional:      Appearance: Normal appearance. He is normal weight.  HENT:     Head: Normocephalic.     Right Ear: Tympanic membrane, ear canal and external ear normal.     Left Ear: Tympanic membrane, ear canal and external ear normal.     Nose: Congestion present. No rhinorrhea.     Mouth/Throat:     Mouth: Mucous membranes are moist.     Pharynx: Oropharynx is clear.  Eyes:     Extraocular Movements: Extraocular movements intact.     Conjunctiva/sclera: Conjunctivae normal.     Pupils: Pupils are equal, round, and reactive to light.  Cardiovascular:     Rate and Rhythm: Normal rate and regular rhythm.     Pulses: Normal pulses.     Heart sounds: Normal heart sounds.  Pulmonary:     Effort: Pulmonary effort is normal.     Breath sounds: Normal breath sounds.  Musculoskeletal:        General: Normal range of motion.  Skin:    General: Skin is warm.  Neurological:     Mental Status: He is alert and oriented to person, place, and time. Mental status is at baseline.  Psychiatric:        Mood and Affect: Mood normal.        Behavior: Behavior normal.     UC Treatments / Results  Labs (all labs ordered are listed, but only abnormal results are displayed) Labs Reviewed - No data to display  EKG   Radiology No results found.  Procedures Procedures (including critical care time)  Medications Ordered in UC Medications - No data to display  Initial Impression / Assessment and Plan / UC Course  I have reviewed the triage vital signs and the nursing notes.  Pertinent labs & imaging results that were available during my care of the patient were reviewed by me and considered in my medical decision making (see chart for details).  Cough Hypertension   Tessalon 100 mg tid prn Robitussin DM 100-10mg / 5 mL every 4 hours prn Continue use of Pepcid daily   [place] 40 m g daily ordered, 1 refill, reviewed PCP note for HTN and lab work 09/17/20, no signs of hypertensive urgency, compliant with regimen  Declined Covid testing  Final Clinical Impressions(s) / UC  Diagnoses   Final diagnoses:  Cough     Discharge Instructions      Can use tessalon pills every 8 hours as needed to help with cough   Can use 24mL cough syrup every 4 hours as needed   Continue to take pepcid daily  Continue to take olmesartan every morning, you have been ordered one month supply of medication for blood pressure and one refill, in two months you will run out of medication. Call Dr. Rondel Baton office so that he can refill your medication for 6 months to a year.    ED Prescriptions     Medication Sig Dispense Auth. Provider   olmesartan (BENICAR) 40 MG tablet Take 1 tablet (40 mg total) by mouth daily. 30 tablet Anavey Coombes R, NP   benzonatate (TESSALON) 100 MG capsule Take 1 capsule (100 mg total) by mouth every 8 (eight) hours. 21 capsule Kenishia Plack R, NP   guaiFENesin-dextromethorphan (ROBITUSSIN DM) 100-10 MG/5ML syrup Take 5 mLs by mouth every 4 (four) hours as needed for cough. 118 mL Menachem Urbanek, Elita Boone, NP      PDMP not reviewed this encounter.   Valinda Hoar, NP 10/14/20 1624    Valinda Hoar, NP 10/14/20 1624

## 2021-09-12 ENCOUNTER — Ambulatory Visit: Payer: 59 | Admitting: Cardiovascular Disease

## 2021-10-20 ENCOUNTER — Ambulatory Visit (INDEPENDENT_AMBULATORY_CARE_PROVIDER_SITE_OTHER): Payer: Medicaid Other | Admitting: Cardiovascular Disease

## 2021-10-20 ENCOUNTER — Encounter: Payer: Self-pay | Admitting: Cardiovascular Disease

## 2021-10-20 VITALS — BP 140/68 | HR 83 | Ht 60.0 in | Wt 159.6 lb

## 2021-10-20 DIAGNOSIS — R0602 Shortness of breath: Secondary | ICD-10-CM

## 2021-10-20 DIAGNOSIS — R0609 Other forms of dyspnea: Secondary | ICD-10-CM | POA: Diagnosis not present

## 2021-10-20 DIAGNOSIS — E782 Mixed hyperlipidemia: Secondary | ICD-10-CM

## 2021-10-20 DIAGNOSIS — I10 Essential (primary) hypertension: Secondary | ICD-10-CM

## 2021-10-20 NOTE — Assessment & Plan Note (Signed)
History of essential hypertension with blood pressure measured today at 140/68.  He is on amlodipine.

## 2021-10-20 NOTE — Assessment & Plan Note (Signed)
Several year history of dyspnea on exertion.  Patient does have positive cardiac risk factors.  2D echo performed 09/22/2012 was normal.  The patient denies chest pain.  He does not wish to pursue an invasive evaluation.  I am going to get a 2D echo for evaluation of LV function.

## 2021-10-20 NOTE — Assessment & Plan Note (Signed)
History of hyperlipidemia on statin therapy with lipid profile performed 09/17/2020 revealing total cholesterol of 176, LDL 116 and HDL 28.

## 2021-10-20 NOTE — Patient Instructions (Signed)
Medication Instructions:  No changes *If you need a refill on your cardiac medications before your next appointment, please call your pharmacy*   Lab Work: None ordered If you have labs (blood work) drawn today and your tests are completely normal, you will receive your results only by: MyChart Message (if you have MyChart) OR A paper copy in the mail If you have any lab test that is abnormal or we need to change your treatment, we will call you to review the results.   Testing/Procedures: Your physician has requested that you have an echocardiogram. Echocardiography is a painless test that uses sound waves to create images of your heart. It provides your doctor with information about the size and shape of your heart and how well your heart's chambers and valves are working. You may receive an ultrasound enhancing agent through an IV if needed to better visualize your heart during the echo.This procedure takes approximately one hour. There are no restrictions for this procedure. This will take place at the [address].     Follow-Up: At [facility], you and your health needs are our priority.  As part of our continuing mission to provide you with exceptional heart care, we have created designated Provider Care Teams.  These Care Teams include your primary Cardiologist (physician) and Advanced Practice Providers (APPs -  Physician Assistants and Nurse Practitioners) who all work together to provide you with the care you need, when you need it.  We recommend signing up for the patient portal called "MyChart".  Sign up information is provided on this After Visit Summary.  MyChart is used to connect with patients for Virtual Visits (Telemedicine).  Patients are able to view lab/test results, encounter notes, upcoming appointments, etc.  Non-urgent messages can be sent to your provider as well.   To learn more about what you can do with MyChart, go to [email or web address].     Your next appointment:   Follow up pending the results  Important Information About Sugar

## 2021-10-20 NOTE — Progress Notes (Signed)
10/20/2021 Todd [name]   1930/12/17  [id number]  Primary Physician Frederica Kuster, MD Primary Cardiologist: Runell Gess MD Todd [name], [place]  HPI:  Todd [name] is a 88 y.o. married [place] male from [place] who is accompanied by his daughter and granddaughter today.  There is an interpreter here as well.  He does not speak [place].  He was referred by Dr. Salli Real office for evaluation of dyspnea on exertion.  He does have a history of treated hypertension and hyperlipidemia.  He is quit smoking 30 years ago.  There is no family history for heart disease.  He is never had heart attack or stroke.  He denies chest pain.  He had dyspnea on exertion for last 3 years for unclear reasons.  He did have an echocardiogram performed in 2014 which was essentially normal.   Current Meds  Medication Sig   amLODipine (NORVASC) 5 MG tablet Take 1 tablet (5 mg total) by mouth daily.   atorvastatin (LIPITOR) 10 MG tablet Take 1 tablet (10 mg total) by mouth daily.     No Known Allergies  Social History   Socioeconomic History   Marital status: Single    Spouse name: Not on file   Number of children: Not on file   Years of education: 0   Highest education level: Not on file  Occupational History   Not on file  Tobacco Use   Smoking status: Former    Years: 20.00    Types: Cigarettes   Smokeless tobacco: Never  Vaping Use   Vaping Use: Never used  Substance and Sexual Activity   Alcohol use: Never   Drug use: Never   Sexual activity: Not on file  Other Topics Concern   Not on file  Social History Narrative   Diet:      Caffeine: Yes      Married, if yes what year: single      Do you live in a house, apartment, assisted living, condo, trailer, ect: house      Is it one or more stories:       How many persons live in your home? 4      Pets: None      Highest level or education completed: no education      Current/Past profession: Past  farmer      Exercise: no                 Type and how often:          Living Will: no   DNR: no   POA/HPOA: no      Functional Status:   Do you have difficulty bathing or dressing yourself? no   Do you have difficulty preparing food or eating? no   Do you have difficulty managing your medications? no   Do you have difficulty managing your finances? no   Do you have difficulty affording your medications? no   Social Determinants of Health   Financial Resource Strain: Not on file  Food Insecurity: Not on file  Transportation Needs: Not on file  Physical Activity: Not on file  Stress: Not on file  Social Connections: Not on file  Intimate Partner Violence: Not on file     Review of Systems: General: negative for chills, fever, night sweats or weight changes.  Cardiovascular: negative for chest pain, dyspnea on exertion, edema, orthopnea, palpitations, paroxysmal nocturnal dyspnea or shortness of breath Dermatological: negative for  rash Respiratory: negative for cough or wheezing Urologic: negative for hematuria Abdominal: negative for nausea, vomiting, diarrhea, bright red blood per rectum, melena, or hematemesis Neurologic: negative for visual changes, syncope, or dizziness All other systems reviewed and are otherwise negative except as noted above.    Blood pressure (!) 140/68, pulse 83, height 5' (1.524 m), weight 159 lb 9.6 oz (72.4 kg), SpO2 95 %.  General appearance: alert and no distress Neck: no adenopathy, no carotid bruit, no JVD, supple, symmetrical, trachea midline, and thyroid not enlarged, symmetric, no tenderness/mass/nodules Lungs: clear to auscultation bilaterally Heart: regular rate and rhythm, S1, S2 normal, no murmur, click, rub or gallop Extremities: extremities normal, atraumatic, no cyanosis or edema Pulses: 2+ and symmetric Skin: Skin color, texture, turgor normal. No rashes or lesions Neurologic: Grossly normal  EKG sinus rhythm at 83 without ST  or T wave changes.  Personally reviewed this EKG.  ASSESSMENT AND PLAN:   Essential hypertension History of essential hypertension with blood pressure measured today at 140/68.  He is on amlodipine.  Hyperlipidemia History of hyperlipidemia on statin therapy with lipid profile performed 09/17/2020 revealing total cholesterol of 176, LDL 116 and HDL 28.  Dyspnea on exertion Several year history of dyspnea on exertion.  Patient does have positive cardiac risk factors.  2D echo performed 09/22/2012 was normal.  The patient denies chest pain.  He does not wish to pursue an invasive evaluation.  I am going to get a 2D echo for evaluation of LV function.     Runell Gess MD FACP,FACC,FAHA, [facility] 10/20/2021 2:16 PM

## 2021-10-29 ENCOUNTER — Ambulatory Visit (HOSPITAL_COMMUNITY): Payer: 59 | Attending: Cardiovascular Disease

## 2021-10-29 DIAGNOSIS — R0602 Shortness of breath: Secondary | ICD-10-CM | POA: Diagnosis present

## 2021-10-29 LAB — ECHOCARDIOGRAM COMPLETE
Area-P 1/2: 2.5 cm2
P 1/2 time: 488 msec
S' Lateral: 2.9 cm

## 2022-04-08 ENCOUNTER — Ambulatory Visit
Admission: EM | Admit: 2022-04-08 | Discharge: 2022-04-08 | Disposition: A | Discharge status: Home / Self Care | Payer: Medicaid Other | Attending: Physician Assistant | Admitting: Physician Assistant

## 2022-04-08 DIAGNOSIS — J22 Unspecified acute lower respiratory infection: Secondary | ICD-10-CM

## 2022-04-08 DIAGNOSIS — R051 Acute cough: Secondary | ICD-10-CM | POA: Diagnosis not present

## 2022-04-08 MED ORDER — DM-GUAIFENESIN ER 30-600 MG PO TB12: 1.0000 | ORAL_TABLET | Freq: Two times a day (BID) | ORAL | 0 refills | Status: DC

## 2022-04-08 NOTE — Discharge Instructions (Signed)
Advised take the Mucinex DM, every 12 hours to help control cough and congestion.  Advised take ibuprofen or Tylenol if needed for fever or bodyaches.  Advised follow-up PCP or return to urgent care if symptoms fail to improve.

## 2022-04-08 NOTE — ED Provider Notes (Signed)
[facility]    CSN: [id number] Arrival date & time: 04/08/22  0630      History   Chief Complaint Chief Complaint  Patient presents with   Cough   Headache    HPI Todd [name] is a 89 y.o. male.   89 year old male presents with cough and congestion.  History is being taken through interpreter services with Spanish being the language.  Patient indicates over the past 5 days he has been having chest congestion and intermittent cough.  He relates when he coughs he gets chest discomfort and mild pain with coughing episodes.  He relates that he has been coughing up phlegm that is thick and yellow.  He indicates that he has had some mild upper respiratory congestion mainly of the left side frontal maxillary and also some ear congestion.  Production has been clear.  He has not had fever, chills, wheezing or shortness of breath.  He has not taken any medicine OTC to help control his symptoms.  He is tolerating fluids well.   Cough Associated symptoms: headaches   Headache Associated symptoms: cough     Past Medical History:  Diagnosis Date   GERD (gastroesophageal reflux disease)    Hyperlipidemia    Hypertension     Patient Active Problem List   Diagnosis Date Noted   Dyspnea on exertion 10/20/2021   Hyperlipidemia    Essential hypertension 09/22/2012   Cardiomegaly 09/21/2012   CAP (community acquired pneumonia) 09/19/2012   Abdominal pain, acute, generalized 09/19/2012   Nausea & vomiting 09/19/2012   Hypertension associated with diabetes ([place]) 09/19/2012   Dehydration, mild 09/19/2012    History reviewed. No pertinent surgical history.     Home Medications    Prior to Admission medications   Medication Sig Start Date End Date Taking? Authorizing Provider  amLODipine (NORVASC) 5 MG tablet Take 1 tablet (5 mg total) by mouth daily. 10/01/17  Yes Jaynee Eagles, PA-C  atorvastatin (LIPITOR) 10 MG tablet Take 1 tablet (10 mg total) by mouth daily.  10/01/17  Yes Jaynee Eagles, PA-C  dextromethorphan-guaiFENesin [facility] DM) 30-600 MG 12hr tablet Take 1 tablet by mouth 2 (two) times daily. 04/08/22  Yes Nyoka Lint, PA-C    Family History Family History  Problem Relation Age of Onset   Arthritis Mother    Heart disease Brother    Heart disease Brother    Heart disease Brother    Heart disease Brother    Heart disease Brother    Hyperlipidemia Son     Social History Social History   Tobacco Use   Smoking status: Former    Years: 20.00    Types: Cigarettes   Smokeless tobacco: Never  Vaping Use   Vaping Use: Never used  Substance Use Topics   Alcohol use: Never   Drug use: Never     Allergies   Patient has no known allergies.   Review of Systems Review of Systems  Respiratory:  Positive for cough.   Neurological:  Positive for headaches.     Physical Exam Triage Vital Signs ED Triage Vitals [04/08/22 0949]  Enc Vitals Group     BP (!) 160/83     Pulse Rate 80     Resp 16     Temp (!) 97.5 F (36.4 C)     Temp Source Oral     SpO2 95 %     Weight      Height      Head Circumference  Peak Flow      Pain Score 0     Pain Loc      Pain Edu?      Excl. in [place]?    No data found.  Updated Vital Signs BP (!) 160/83 (BP Location: Right Arm)   Pulse 80   Temp (!) 97.5 F (36.4 C) (Oral)   Resp 16   SpO2 95%   Visual Acuity Right Eye Distance:   Left Eye Distance:   Bilateral Distance:    Right Eye Near:   Left Eye Near:    Bilateral Near:     Physical Exam Constitutional:      Appearance: He is well-developed.  HENT:     Right Ear: Tympanic membrane is injected.     Left Ear: Tympanic membrane is injected.     Mouth/Throat:     Mouth: Mucous membranes are moist.     Pharynx: Oropharynx is clear.  Cardiovascular:     Rate and Rhythm: Normal rate and regular rhythm.     Heart sounds: Normal heart sounds.  Pulmonary:     Effort: Pulmonary effort is normal.     Breath sounds: Normal  breath sounds and air entry. No wheezing, rhonchi or rales.  Lymphadenopathy:     Cervical: No cervical adenopathy.  Neurological:     Mental Status: He is alert.      UC Treatments / Results  Labs (all labs ordered are listed, but only abnormal results are displayed) Labs Reviewed - No data to display  EKG   Radiology No results found.  Procedures Procedures (including critical care time)  Medications Ordered in UC Medications - No data to display  Initial Impression / Assessment and Plan / UC Course  I have reviewed the triage vital signs and the nursing notes.  Pertinent labs & imaging results that were available during my care of the patient were reviewed by me and considered in my medical decision making (see chart for details).    Plan: The diagnosis will be treated with the following: 1.  Lower respiratory tract infection: A.  Mucinex DM every 12 hours to control cough and congestion. B.  Tylenol or ibuprofen as needed for fever or bodyaches. 2.  Cough: A.  Mucinex DM every 12 hours to control cough and congestion. 3.  Advised follow-up PCP or return to urgent care if needed. Final Clinical Impressions(s) / UC Diagnoses   Final diagnoses:  Lower respiratory infection  Acute cough     Discharge Instructions      Advised take the Mucinex DM, every 12 hours to help control cough and congestion.  Advised take ibuprofen or Tylenol if needed for fever or bodyaches.  Advised follow-up PCP or return to urgent care if symptoms fail to improve.    ED Prescriptions     Medication Sig Dispense Auth. Provider   dextromethorphan-guaiFENesin (MUCINEX DM) 30-600 MG 12hr tablet Take 1 tablet by mouth 2 (two) times daily. 20 tablet Nyoka Lint, PA-C      PDMP not reviewed this encounter.   Nyoka Lint, PA-C 04/08/22 1009

## 2022-04-08 NOTE — ED Triage Notes (Signed)
Cough, pain in his chest when he coughs and headache that started 5 days ago. Not taking any OTC medication.

## 2022-08-24 ENCOUNTER — Emergency Department (HOSPITAL_BASED_OUTPATIENT_CLINIC_OR_DEPARTMENT_OTHER): Payer: Medicaid Other

## 2022-08-24 ENCOUNTER — Emergency Department (HOSPITAL_BASED_OUTPATIENT_CLINIC_OR_DEPARTMENT_OTHER)
Admission: EM | Admit: 2022-08-24 | Discharge: 2022-08-24 | Disposition: A | Discharge status: Home / Self Care | Payer: Medicaid Other | Attending: Emergency Medicine | Admitting: Emergency Medicine

## 2022-08-24 ENCOUNTER — Other Ambulatory Visit: Payer: Self-pay

## 2022-08-24 ENCOUNTER — Encounter (HOSPITAL_BASED_OUTPATIENT_CLINIC_OR_DEPARTMENT_OTHER): Payer: Self-pay

## 2022-08-24 DIAGNOSIS — Z79899 Other long term (current) drug therapy: Secondary | ICD-10-CM | POA: Diagnosis not present

## 2022-08-24 DIAGNOSIS — Z20822 Contact with and (suspected) exposure to covid-19: Secondary | ICD-10-CM | POA: Insufficient documentation

## 2022-08-24 DIAGNOSIS — R531 Weakness: Secondary | ICD-10-CM | POA: Insufficient documentation

## 2022-08-24 DIAGNOSIS — I10 Essential (primary) hypertension: Secondary | ICD-10-CM | POA: Insufficient documentation

## 2022-08-24 LAB — RESP PANEL BY RT-PCR (RSV, FLU A&B, COVID)  RVPGX2
Influenza A by PCR: NEGATIVE
Influenza B by PCR: NEGATIVE
Resp Syncytial Virus by PCR: NEGATIVE
SARS Coronavirus 2 by RT PCR: NEGATIVE

## 2022-08-24 LAB — URINALYSIS, MICROSCOPIC (REFLEX)

## 2022-08-24 LAB — CBC WITH DIFFERENTIAL/PLATELET
Abs Immature Granulocytes: 0.01 10*3/uL (ref 0.00–0.07)
Basophils Absolute: 0 10*3/uL (ref 0.0–0.1)
Basophils Relative: 1 %
Eosinophils Absolute: 0.2 10*3/uL (ref 0.0–0.5)
Eosinophils Relative: 3 %
HCT: 40.9 % (ref 39.0–52.0)
Hemoglobin: 13.8 g/dL (ref 13.0–17.0)
Immature Granulocytes: 0 %
Lymphocytes Relative: 23 %
Lymphs Abs: 1.4 10*3/uL (ref 0.7–4.0)
MCH: 27.1 pg (ref 26.0–34.0)
MCHC: 33.7 g/dL (ref 30.0–36.0)
MCV: 80.4 fL (ref 80.0–100.0)
Monocytes Absolute: 0.5 10*3/uL (ref 0.1–1.0)
Monocytes Relative: 9 %
Neutro Abs: 4 10*3/uL (ref 1.7–7.7)
Neutrophils Relative %: 64 %
Platelets: 161 10*3/uL (ref 150–400)
RBC: 5.09 MIL/uL (ref 4.22–5.81)
RDW: 13.5 % (ref 11.5–15.5)
WBC: 6.1 10*3/uL (ref 4.0–10.5)
nRBC: 0 % (ref 0.0–0.2)

## 2022-08-24 LAB — COMPREHENSIVE METABOLIC PANEL
ALT: 16 U/L (ref 0–44)
AST: 19 U/L (ref 15–41)
Albumin: 3.6 g/dL (ref 3.5–5.0)
Alkaline Phosphatase: 95 U/L (ref 38–126)
Anion gap: 9 (ref 5–15)
BUN: 17 mg/dL (ref 8–23)
CO2: 21 mmol/L — ABNORMAL LOW (ref 22–32)
Calcium: 7.9 mg/dL — ABNORMAL LOW (ref 8.9–10.3)
Chloride: 105 mmol/L (ref 98–111)
Creatinine, Ser: 1.08 mg/dL (ref 0.61–1.24)
GFR, Estimated: >60 mL/min (ref >60)
Glucose, Bld: 179 mg/dL — ABNORMAL HIGH (ref 70–99)
Potassium: 3.1 mmol/L — ABNORMAL LOW (ref 3.5–5.1)
Sodium: 135 mmol/L (ref 135–145)
Total Bilirubin: 0.5 mg/dL (ref 0.3–1.2)
Total Protein: 7.7 g/dL (ref 6.5–8.1)

## 2022-08-24 LAB — URINALYSIS, ROUTINE W REFLEX MICROSCOPIC
Bilirubin Urine: NEGATIVE
Glucose, UA: NEGATIVE mg/dL
Ketones, ur: NEGATIVE mg/dL
Leukocytes,Ua: NEGATIVE
Nitrite: NEGATIVE
Protein, ur: 30 mg/dL — AB
Specific Gravity, Urine: >=1.03 (ref 1.005–1.030)
pH: 5.5 (ref 5.0–8.0)

## 2022-08-24 LAB — LIPASE, BLOOD: Lipase: 40 U/L (ref 11–51)

## 2022-08-24 LAB — TROPONIN I (HIGH SENSITIVITY): Troponin I (High Sensitivity): 3 ng/L (ref <18)

## 2022-08-24 MED ORDER — ACETAMINOPHEN 325 MG PO TABS
650.0000 mg | ORAL_TABLET | Freq: Once | ORAL | Status: AC
  Administered 2022-08-24: 650 mg via ORAL
  Filled 2022-08-24: qty 2

## 2022-08-24 MED ORDER — SODIUM CHLORIDE 0.9 % IV BOLUS
1000.0000 mL | Freq: Once | INTRAVENOUS | Status: AC
  Administered 2022-08-24: 1000 mL via INTRAVENOUS

## 2022-08-24 MED ORDER — IOHEXOL 350 MG/ML SOLN
100.0000 mL | Freq: Once | INTRAVENOUS | Status: AC | PRN
  Administered 2022-08-24: 100 mL via INTRAVENOUS

## 2022-08-24 NOTE — ED Notes (Signed)
Patient transported to CT 

## 2022-08-24 NOTE — ED Triage Notes (Signed)
Pt arrives with c/o generalized body pain that started 2 days ago. Pt endorses chills, generalized weakness and dizziness. Pt denies fevers or SOB.

## 2022-08-24 NOTE — ED Provider Notes (Signed)
[place] [facility] Provider Note   CSN: [id number] Arrival date & time: 08/24/22  1537     History  Chief Complaint  Patient presents with   Generalized Body Aches    Todd [name] is a 89 y.o. male.  Patient here with generalized weakness.  Pain all over.  For the last 2 days.  Blood pressure has been elevated.  Has a history of high cholesterol, hypertension.  He denies any specific fever cough or sputum production.  Maybe has abdominal pain, back pain may be chest pain.  He been eating and drinking without any issues.  No black stools or bloody sputum.  No nausea vomiting diarrhea.  The history is provided by the patient and a caregiver. A language interpreter was used.       Home Medications Prior to Admission medications   Medication Sig Start Date End Date Taking? Authorizing Provider  amLODipine (NORVASC) 5 MG tablet Take 1 tablet (5 mg total) by mouth daily. 10/01/17   Wallis Bamberg, PA-C  atorvastatin (LIPITOR) 10 MG tablet Take 1 tablet (10 mg total) by mouth daily. 10/01/17   Wallis Bamberg, PA-C  dextromethorphan-guaiFENesin [facility] DM) 30-600 MG 12hr tablet Take 1 tablet by mouth 2 (two) times daily. 04/08/22   Ellsworth Lennox, PA-C      Allergies    Patient has no known allergies.    Review of Systems   Review of Systems  Physical Exam Updated Vital Signs BP (!) 160/77   Pulse 81   Temp 98 F (36.7 C)   Resp (!) 30   Ht 5\' 2"  (1.575 m)   Wt 72.9 kg   SpO2 95%   BMI 29.41 kg/m  Physical Exam Vitals and nursing note reviewed.  Constitutional:      General: He is not in acute distress.    Appearance: He is well-developed. He is not ill-appearing.  HENT:     Head: Normocephalic and atraumatic.     Nose: Nose normal.     Mouth/Throat:     Mouth: Mucous membranes are moist.  Eyes:     Extraocular Movements: Extraocular movements intact.     Conjunctiva/sclera: Conjunctivae normal.     Pupils: Pupils are equal,  round, and reactive to light.  Cardiovascular:     Rate and Rhythm: Normal rate and regular rhythm.     Pulses: Normal pulses.     Heart sounds: Normal heart sounds. No murmur heard. Pulmonary:     Effort: Pulmonary effort is normal. No respiratory distress.     Breath sounds: Normal breath sounds.  Abdominal:     General: Abdomen is flat.     Palpations: Abdomen is soft.     Tenderness: There is no abdominal tenderness.  Musculoskeletal:        General: No swelling.     Cervical back: Normal range of motion and neck supple.  Skin:    General: Skin is warm and dry.     Capillary Refill: Capillary refill takes less than 2 seconds.  Neurological:     General: No focal deficit present.     Mental Status: He is alert and oriented to person, place, and time.     Cranial Nerves: No cranial nerve deficit.     Sensory: No sensory deficit.     Motor: No weakness.     Coordination: Coordination normal.  Psychiatric:        Mood and Affect: Mood normal.  ED Results / Procedures / Treatments   Labs (all labs ordered are listed, but only abnormal results are displayed) Labs Reviewed  COMPREHENSIVE METABOLIC PANEL - Abnormal; Notable for the following components:      Result Value   Potassium 3.1 (*)    CO2 21 (*)    Glucose, Bld 179 (*)    Calcium 7.9 (*)    All other components within normal limits  URINALYSIS, ROUTINE W REFLEX MICROSCOPIC - Abnormal; Notable for the following components:   Hgb urine dipstick TRACE (*)    Protein, ur 30 (*)    All other components within normal limits  URINALYSIS, MICROSCOPIC (REFLEX) - Abnormal; Notable for the following components:   Bacteria, UA RARE (*)    All other components within normal limits  RESP PANEL BY RT-PCR (RSV, FLU A&B, COVID)  RVPGX2  CBC WITH DIFFERENTIAL/PLATELET  LIPASE, BLOOD  TROPONIN I (HIGH SENSITIVITY)    EKG None  Radiology CT Angio Chest/Abd/Pel for Dissection W and/or Wo Contrast  Result Date:  08/24/2022 CLINICAL DATA:  Chest and abdominal pain for 2 days, initial encounter EXAM: CT ANGIOGRAPHY CHEST, ABDOMEN AND PELVIS TECHNIQUE: Non-contrast CT of the chest was initially obtained. Multidetector CT imaging through the chest, abdomen and pelvis was performed using the standard protocol during bolus administration of intravenous contrast. Multiplanar reconstructed images and MIPs were obtained and reviewed to evaluate the vascular anatomy. RADIATION DOSE REDUCTION: This exam was performed according to the departmental dose-optimization program which includes automated exposure control, adjustment of the mA and/or kV according to patient size and/or use of iterative reconstruction technique. CONTRAST:  OMNIPAQUE IOHEXOL 350 MG/ML SOLN COMPARISON:  Chest x-ray from earlier in the same day, CT from 08/19/2012. FINDINGS: CTA CHEST FINDINGS Cardiovascular: Initial precontrast images demonstrate atherosclerotic calcifications. No aneurysmal dilatation is noted. Post-contrast images show no aortic dissection. No cardiac enlargement is noted. The pulmonary artery shows a normal branching pattern bilaterally. No filling defect to suggest pulmonary embolism is noted. Mediastinum/Nodes: Thoracic inlet is within normal limits. No hilar or mediastinal adenopathy is noted. The esophagus as visualized is within normal limits. Lungs/Pleura: Lungs are clear. No pleural effusion or pneumothorax. 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 Review of the MIP images confirms the above findings. CTA ABDOMEN AND PELVIS FINDINGS VASCULAR Aorta: Atherosclerotic calcifications are noted without aneurysmal dilatation or dissection. Celiac: Patent without evidence of aneurysm, dissection, vasculitis or significant stenosis. SMA: Patent without evidence of aneurysm, dissection, vasculitis or significant stenosis. 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 IMA: Patent without evidence of aneurysm,  dissection, vasculitis or significant stenosis. Inflow: Iliacs demonstrate atherosclerotic calcification without aneurysmal dilatation or dissection. Veins: No specific venous abnormality is noted. Review of the MIP images confirms the above findings. NON-VASCULAR Hepatobiliary: Cysts are noted in the left lobe of the liver stable in appearance from prior exam. Gallbladder appears within normal limits. Pancreas: Unremarkable. No pancreatic ductal dilatation or surrounding inflammatory changes. Spleen: Normal in size without focal abnormality. Adrenals/Urinary Tract: Adrenal glands are within normal limits. Kidneys demonstrate a normal enhancement pattern bilaterally. No renal calculi or obstructive changes are seen. The bladder is well distended. Stomach/Bowel: Scattered diverticular change of the colon is noted without evidence of diverticulitis. No obstructive changes are seen. The appendix is within normal limits. Small bowel and stomach are unremarkable. Lymphatic: No lymphadenopathy is noted. Reproductive: Prostate is unremarkable. Other: No abdominal wall hernia or abnormality. No abdominopelvic ascites. Musculoskeletal: No acute or significant osseous findings. Review of  the MIP images confirms the above findings. IMPRESSION: No evidence of aortic aneurysm or dissection. Diverticular change without diverticulitis. Scattered chronic changes without acute abnormality. Electronically Signed   By: Alcide Clever M.D.   On: 08/24/2022 20:01   DG Chest Portable 1 View  Result Date: 08/24/2022 CLINICAL DATA:  Pain EXAM: PORTABLE CHEST 1 VIEW COMPARISON:  X-ray 09/20/2012 FINDINGS: No consolidation, pneumothorax or effusion. No edema. Stable cardiopericardial silhouette. Degenerative changes of the spine IMPRESSION: No acute cardiopulmonary disease Electronically Signed   By: Karen Kays M.D.   On: 08/24/2022 17:25    Procedures Procedures    Medications Ordered in ED Medications  acetaminophen (TYLENOL)  tablet 650 mg (650 mg Oral Given 08/24/22 1743)  sodium chloride 0.9 % bolus 1,000 mL (0 mLs Intravenous Stopped 08/24/22 1854)  iohexol (OMNIPAQUE) 350 MG/ML injection 100 mL (100 mLs Intravenous Contrast Given 08/24/22 1913)    ED Course/ Medical Decision Making/ A&P                             Medical Decision Making Amount and/or Complexity of Data Reviewed Labs: ordered. Radiology: ordered.  Risk OTC drugs. Prescription drug management.   Cinque Lockard is here with generalized pain, high blood pressure.  Vital signs overall unremarkable except for mildly elevated blood pressure.  Wide differential as vague symptoms.  Differential could be ACS versus less likely AAA/dissection versus viral process versus infectious process.  Will get CBC, MP, lipase, troponin, CT chest abdomen pelvis, urinalysis, COVID and flu testing.  Will give fluid bolus and Tylenol and reevaluate.  EKG shows sinus rhythm.  No ischemic changes.  Lab works unremarkable.  No significant anemia or electrolyte abnormality or kidney injury or leukocytosis.  No lung infection, no UTI.  Dissection study is unremarkable.  Overall suspect probably viral process.  Discharged in good condition.  No concern for ACS or other acute process.  This chart was dictated using voice recognition software.  Despite best efforts to proofread,  errors can occur which can change the documentation meaning.         Final Clinical Impression(s) / ED Diagnoses Final diagnoses:  Weakness    Rx / DC Orders ED Discharge Orders     None         Virgina Norfolk, DO 08/24/22 2006

## 2022-08-24 NOTE — ED Notes (Signed)
Spoke with lab to add on trop 

## 2022-08-25 ENCOUNTER — Telehealth: Payer: Self-pay

## 2022-08-25 NOTE — Transitions of Care (Post Inpatient/ED Visit) (Signed)
   08/25/2022  Name: Todd [name] MRN: [id number] DOB: 04-Nov-1930  Today's TOC FU Call Status:    Attempted to reach the patient regarding the most recent Inpatient/ED visit.  Follow Up Plan: Additional outreach attempts will be made to reach the patient to complete the Transitions of Care (Post Inpatient/ED visit) call.   Signature : .cma

## 2022-08-26 ENCOUNTER — Telehealth: Payer: Self-pay

## 2022-08-26 NOTE — Transitions of Care (Post Inpatient/ED Visit) (Signed)
   08/26/2022  Name: Todd [name] MRN: [id number] DOB: 1931/02/19  Today's TOC FU Call Status: Today's TOC FU Call Status:: Unsuccessful Call (2nd Attempt) Unsuccessful Call (2nd Attempt) Date: 08/26/22  Attempted to reach the patient regarding the most recent Inpatient/ED visit.  Follow Up Plan: Additional outreach attempts will be made to reach the patient to complete the Transitions of Care (Post Inpatient/ED visit) call.   Signature [place]

## 2022-10-16 ENCOUNTER — Ambulatory Visit
Admission: EM | Admit: 2022-10-16 | Discharge: 2022-10-16 | Disposition: A | Discharge status: Home / Self Care | Payer: Medicaid Other | Attending: Internal Medicine | Admitting: Internal Medicine

## 2022-10-16 ENCOUNTER — Encounter: Payer: Self-pay | Admitting: Family Medicine

## 2022-10-16 ENCOUNTER — Ambulatory Visit (INDEPENDENT_AMBULATORY_CARE_PROVIDER_SITE_OTHER): Payer: Medicaid Other

## 2022-10-16 DIAGNOSIS — U071 COVID-19: Secondary | ICD-10-CM | POA: Insufficient documentation

## 2022-10-16 DIAGNOSIS — J209 Acute bronchitis, unspecified: Secondary | ICD-10-CM

## 2022-10-16 DIAGNOSIS — R051 Acute cough: Secondary | ICD-10-CM | POA: Diagnosis not present

## 2022-10-16 MED ORDER — BENZONATATE 100 MG PO CAPS
100.0000 mg | ORAL_CAPSULE | Freq: Three times a day (TID) | ORAL | 0 refills | Status: DC
Start: 2022-10-16 — End: 2023-03-31

## 2022-10-16 MED ORDER — AMOXICILLIN-POT CLAVULANATE 875-125 MG PO TABS
1.0000 | ORAL_TABLET | Freq: Two times a day (BID) | ORAL | 0 refills | Status: DC
Start: 2022-10-16 — End: 2023-03-31

## 2022-10-16 NOTE — ED Triage Notes (Signed)
Pt presents to UC w/ c/o pain in chest d/t cough, body aches, and fever x4 days. Pt has taken Mucinex, tylenol without relief  Interpreter [place] [phone]

## 2022-10-16 NOTE — Discharge Instructions (Signed)
Start Augmentin twice daily for 7 days.  You may take Tessalon as needed for cough.  Lots of rest and fluids.  Please follow-up with your PCP in 1 to 2 days for recheck.  Please go to the ER if you develop any worsening symptoms.  I hope you feel better soon!

## 2022-10-16 NOTE — ED Provider Notes (Signed)
[facility]    CSN: [id number] Arrival date & time: 10/16/22  4098      History   Chief Complaint No chief complaint on file.   HPI Todd [name] is a 89 y.o. male  presents for evaluation of URI symptoms for 4 days.  Patient is Spanish-speaking and interpretation line was used.  Patient reports associated symptoms of dry cough with congestion, sore throat, subjective fevers, shortness of breath. Denies N/V/D, ear pain, body aches, documented fevers. Patient does not have a hx of asthma or smoking. No known sick contacts.  Pt has taken Mucinex OTC for symptoms. Pt has no other concerns at this time.   HPI  Past Medical History:  Diagnosis Date   GERD (gastroesophageal reflux disease)    Hyperlipidemia    Hypertension     Patient Active Problem List   Diagnosis Date Noted   Dyspnea on exertion 10/20/2021   Hyperlipidemia    Essential hypertension 09/22/2012   Cardiomegaly 09/21/2012   CAP (community acquired pneumonia) 09/19/2012   Abdominal pain, acute, generalized 09/19/2012   Nausea & vomiting 09/19/2012   Hypertension associated with diabetes (HCC) 09/19/2012   Dehydration, mild 09/19/2012    History reviewed. No pertinent surgical history.     Home Medications    Prior to Admission medications   Medication Sig Start Date End Date Taking? Authorizing Provider  amoxicillin-clavulanate (AUGMENTIN) 875-125 MG tablet Take 1 tablet by mouth every 12 (twelve) hours. 10/16/22  Yes Radford Pax, NP  benzonatate (TESSALON) 100 MG capsule Take 1 capsule (100 mg total) by mouth every 8 (eight) hours. 10/16/22  Yes Radford Pax, NP  amLODipine (NORVASC) 5 MG tablet Take 1 tablet (5 mg total) by mouth daily. 10/01/17   Wallis Bamberg, PA-C  atorvastatin (LIPITOR) 10 MG tablet Take 1 tablet (10 mg total) by mouth daily. 10/01/17   Wallis Bamberg, PA-C  dextromethorphan-guaiFENesin [facility] DM) 30-600 MG 12hr tablet Take 1 tablet by mouth 2 (two) times daily. 04/08/22    Ellsworth Lennox, PA-C    Family History Family History  Problem Relation Age of Onset   Arthritis Mother    Heart disease Brother    Heart disease Brother    Heart disease Brother    Heart disease Brother    Heart disease Brother    Hyperlipidemia Son     Social History Social History   Tobacco Use   Smoking status: Former    Types: Cigarettes   Smokeless tobacco: Never  Vaping Use   Vaping status: Never Used  Substance Use Topics   Alcohol use: Never   Drug use: Never     Allergies   Patient has no known allergies.   Review of Systems Review of Systems  Constitutional:  Positive for fever.  HENT:  Positive for congestion and sore throat.   Respiratory:  Positive for cough and shortness of breath.      Physical Exam Triage Vital Signs ED Triage Vitals  Encounter Vitals Group     BP 10/16/22 1100 (!) 177/89     Systolic BP Percentile --      Diastolic BP Percentile --      Pulse Rate 10/16/22 1100 82     Resp 10/16/22 1100 16     Temp 10/16/22 1100 97.8 F (36.6 C)     Temp Source 10/16/22 1100 Oral     SpO2 10/16/22 1100 96 %     Weight --      Height --  Head Circumference --      Peak Flow --      Pain Score 10/16/22 1105 1     Pain Loc --      Pain Education --      Exclude from Growth Chart --    No data found.  Updated Vital Signs BP (!) 177/89 (BP Location: Right Arm)   Pulse 82   Temp 97.8 F (36.6 C) (Oral)   Resp 16   SpO2 96%   Visual Acuity Right Eye Distance:   Left Eye Distance:   Bilateral Distance:    Right Eye Near:   Left Eye Near:    Bilateral Near:     Physical Exam Vitals and nursing note reviewed.  Constitutional:      General: He is not in acute distress.    Appearance: Normal appearance. He is not ill-appearing or toxic-appearing.  HENT:     Head: Normocephalic and atraumatic.     Right Ear: Tympanic membrane and ear canal normal.     Left Ear: Tympanic membrane and ear canal normal.     Nose:  Congestion present.     Mouth/Throat:     Mouth: Mucous membranes are moist.     Pharynx: No oropharyngeal exudate or posterior oropharyngeal erythema.  Eyes:     Pupils: Pupils are equal, round, and reactive to light.  Cardiovascular:     Rate and Rhythm: Normal rate and regular rhythm.     Heart sounds: Normal heart sounds.  Pulmonary:     Effort: Pulmonary effort is normal.     Breath sounds: Normal breath sounds.  Musculoskeletal:     Cervical back: Normal range of motion and neck supple.  Lymphadenopathy:     Cervical: No cervical adenopathy.  Skin:    General: Skin is warm and dry.  Neurological:     General: No focal deficit present.     Mental Status: He is alert and oriented to person, place, and time.  Psychiatric:        Mood and Affect: Mood normal.        Behavior: Behavior normal.      UC Treatments / Results  Labs (all labs ordered are listed, but only abnormal results are displayed) Labs Reviewed  SARS CORONAVIRUS 2 (TAT 6-24 HRS)    EKG   Radiology DG Chest 2 View  Result Date: 10/16/2022 CLINICAL DATA:  Cough and fever EXAM: CHEST - 2 VIEW COMPARISON:  08/24/2022 FINDINGS: Cardiomediastinal silhouette and pulmonary vasculature are within normal limits. Lungs are clear. IMPRESSION: No acute cardiopulmonary process. Electronically Signed   By: Acquanetta Belling M.D.   On: 10/16/2022 12:33    Procedures Procedures (including critical care time)  Medications Ordered in UC Medications - No data to display  Initial Impression / Assessment and Plan / UC Course  I have reviewed the triage vital signs and the nursing notes.  Pertinent labs & imaging results that were available during my care of the patient were reviewed by me and considered in my medical decision making (see chart for details).     COVID PCR and will contact if positive.  Reviewed exam and symptoms with patient and family.  No red flags.  Given presentation will start Augmentin and Tessalon.   Encouraged rest and fluids.  PCP follow-up 1 to 2 days for recheck.  ER precautions reviewed and patient and family verbalized understanding. Final Clinical Impressions(s) / UC Diagnoses   Final diagnoses:  Acute cough  Acute bronchitis, unspecified organism     Discharge Instructions      Start Augmentin twice daily for 7 days.  You may take Tessalon as needed for cough.  Lots of rest and fluids.  Please follow-up with your PCP in 1 to 2 days for recheck.  Please go to the ER if you develop any worsening symptoms.  I hope you feel better soon!     ED Prescriptions     Medication Sig Dispense Auth. Provider   benzonatate (TESSALON) 100 MG capsule Take 1 capsule (100 mg total) by mouth every 8 (eight) hours. 21 capsule Radford Pax, NP   amoxicillin-clavulanate (AUGMENTIN) 875-125 MG tablet Take 1 tablet by mouth every 12 (twelve) hours. 14 tablet Radford Pax, NP      PDMP not reviewed this encounter.   Radford Pax, NP 10/16/22 1240

## 2022-10-19 ENCOUNTER — Encounter: Payer: Self-pay | Admitting: Family Medicine

## 2022-10-19 NOTE — Progress Notes (Unsigned)
Cardiology Office Note:  .   Date:  10/20/2022  ID:  Todd [name], DOB 01/05/1931, MRN [id number] PCP: Frederica Kuster, MD   HeartCare Providers Cardiologist:  Nanetta Batty, MD     History of Present Illness: .   Todd [name] is a 89 y.o. male with past medical history of HTN, HLD, former tobacco use.  He is from [place] He was first seen by Dr. Allyson Sabal on 10/20/2021 for dyspnea on exertion.  He complained of dyspnea on exertion since 2020 for unclear reason.  He declined invasive evaluation at that time.  Echocardiogram 10/2021 indicated EF of 60 to 65%, LV with normal function, no RWMA, mild LV hypertrophy.  There was aortic valve sclerosis with no evidence of stenosis.  Since last visit, patient presented to the ED on 6/70/2024 with generalized body ache.  Respiratory panel was negative for influenza or COVID.  Chest x-ray showed no acute process.  CT of the chest abdomen pelvis showed no evidence of aortic aneurysm or dissection, diverticular change without diverticulitis.  He returned back to the ED on 10/16/2022 with sore throat, congestion, subjective fever and shortness of breath.  He was diagnosed with COVID.  Chest x-ray showed no acute process.  Patient presents today for evaluation of left-sided chest discomfort and palpitation.  Chest discomfort has been going on for the past 9-month and palpitation has been going on for the past 2 months.  Even with translator, he was unable to tell me precisely the characteristic of his palpitation.  He was unable to tell me if his palpitation feels like skipped heartbeat or persistent tachypalpitation.  He has chest discomfort may occur with walking however also occurs at rest when he is lifting things.  He says symptom has been more noticeable in the past week since he has been diagnosed with COVID.  I recommend the addition of aspirin and metoprolol tartrate 25 mg twice a day.  I will reassess the patient in early September to see  if his symptom would improve.  Daughter was present during today's interview, she says the patient does not tell her everything and he wished to go back to [place] even though the daughter wanted to take care of the patient here in [place].  It does appears patient plans to go back to [place] for 81-month from September until December.  I will try to see the patient back prior to his overseas trip.   ROS:   Patient complains of chest discomfort and palpitation.  Studies Reviewed: Marland Kitchen   EKG Interpretation Date/Time:  Tuesday October 20 2022 10:40:14 EDT Ventricular Rate:  72 PR Interval:  172 QRS Duration:  82 QT Interval:  416 QTC Calculation: 455 R Axis:   22  Text Interpretation: Normal sinus rhythm Normal ECG Confirmed by Azalee Course [id number]) on 10/20/2022 10:41:06 AM    Cardiac Studies & Procedures       ECHOCARDIOGRAM  ECHOCARDIOGRAM COMPLETE 10/29/2021  Narrative ECHOCARDIOGRAM REPORT    Patient Name:   Todd [name] Date of Exam: 10/29/2021 Medical Rec #:  [id number]          Height:       60.0 in Accession #:    [id number]         Weight:       159.6 lb Date of Birth:  1930-05-28          BSA:          1.696 m Patient Age:  89 years           BP:           152/92 mmHg Patient Gender: M                  HR:           72 bpm. Exam Location:  [address]  Procedure: 2D Echo, Cardiac Doppler and Color Doppler  Indications:    R06.02 SOB  History:        Patient has prior history of Echocardiogram examinations, most recent 09/22/2012. Signs/Symptoms:Shortness of Breath; Risk Factors:Former Smoker, Hypertension and Dyslipidemia.  Sonographer:    Samule Ohm RDCS Referring Phys: [phone] JONATHAN J BERRY  IMPRESSIONS   1. Left ventricular ejection fraction, by estimation, is 60 to 65%. The left ventricle has normal function. The left ventricle has no regional wall motion abnormalities. There is mild left ventricular hypertrophy. Left ventricular  diastolic parameters were normal. 2. Right ventricular systolic function is normal. The right ventricular size is normal. 3. Left atrial size was mildly dilated. 4. The mitral valve is abnormal. Trivial mitral valve regurgitation. No evidence of mitral stenosis. 5. The aortic valve is tricuspid. There is mild calcification of the aortic valve. Aortic valve regurgitation is not visualized. Aortic valve sclerosis is present, with no evidence of aortic valve stenosis. 6. The inferior vena cava is normal in size with greater than 50% respiratory variability, suggesting right atrial pressure of 3 mmHg.  FINDINGS Left Ventricle: Left ventricular ejection fraction, by estimation, is 60 to 65%. The left ventricle has normal function. The left ventricle has no regional wall motion abnormalities. The left ventricular internal cavity size was normal in size. There is mild left ventricular hypertrophy. Left ventricular diastolic parameters were normal.  Right Ventricle: The right ventricular size is normal. No increase in right ventricular wall thickness. Right ventricular systolic function is normal.  Left Atrium: Left atrial size was mildly dilated.  Right Atrium: Right atrial size was normal in size.  Pericardium: There is no evidence of pericardial effusion.  Mitral Valve: The mitral valve is abnormal. There is mild thickening of the mitral valve leaflet(s). Mild mitral annular calcification. Trivial mitral valve regurgitation. No evidence of mitral valve stenosis.  Tricuspid Valve: The tricuspid valve is normal in structure. Tricuspid valve regurgitation is not demonstrated. No evidence of tricuspid stenosis.  Aortic Valve: The aortic valve is tricuspid. There is mild calcification of the aortic valve. Aortic valve regurgitation is not visualized. Aortic regurgitation PHT measures 488 msec. Aortic valve sclerosis is present, with no evidence of aortic valve stenosis.  Pulmonic Valve: The pulmonic  valve was normal in structure. Pulmonic valve regurgitation is not visualized. No evidence of pulmonic stenosis.  Aorta: The aortic root is normal in size and structure.  Venous: The inferior vena cava is normal in size with greater than 50% respiratory variability, suggesting right atrial pressure of 3 mmHg.  IAS/Shunts: No atrial level shunt detected by color flow Doppler.   LEFT VENTRICLE PLAX 2D LVIDd:         4.30 cm   Diastology LVIDs:         2.90 cm   LV e' medial:    7.29 cm/s LV PW:         1.10 cm   LV E/e' medial:  12.0 LV IVS:        1.30 cm   LV e' lateral:   9.57 cm/s LVOT diam:     2.00  cm   LV E/e' lateral: 9.1 LV SV:         73 LV SV Index:   43 LVOT Area:     3.14 cm   RIGHT VENTRICLE             IVC RV S prime:     15.40 cm/s  IVC diam: 1.70 cm TAPSE (M-mode): 1.5 cm  LEFT ATRIUM             Index        RIGHT ATRIUM           Index LA diam:        3.80 cm 2.24 cm/m   RA Pressure: 3.00 mmHg LA Vol (A2C):   38.3 ml 22.58 ml/m  RA Area:     12.80 cm LA Vol (A4C):   43.3 ml 25.53 ml/m  RA Volume:   28.70 ml  16.92 ml/m LA Biplane Vol: 41.8 ml 24.65 ml/m AORTIC VALVE LVOT Vmax:   110.00 cm/s LVOT Vmean:  67.800 cm/s LVOT VTI:    0.233 m AI PHT:      488 msec  AORTA Ao Root diam: 3.50 cm Ao Asc diam:  3.50 cm  MITRAL VALVE               TRICUSPID VALVE MV Area (PHT): 2.50 cm    Estimated RAP:  3.00 mmHg MV Decel Time: 303 msec MV E velocity: 87.50 cm/s  SHUNTS MV A velocity: 98.50 cm/s  Systemic VTI:  0.23 m MV E/A ratio:  0.89        Systemic Diam: 2.00 cm  Charlton Haws MD Electronically signed by Charlton Haws MD Signature Date/Time: 10/29/2021/4:12:17 PM    Final             Risk Assessment/Calculations:            Physical Exam:   VS:  BP (!) 150/76 (BP Location: Left Arm, Cuff Size: Normal)   Pulse 71   Ht 5\' 4"  (1.626 m)   Wt 157 lb 12.8 oz (71.6 kg)   SpO2 91%   BMI 27.09 kg/m    Wt Readings from Last 3 Encounters:   10/20/22 157 lb 12.8 oz (71.6 kg)  08/24/22 160 lb 12.8 oz (72.9 kg)  10/20/21 159 lb 9.6 oz (72.4 kg)    GEN: Well nourished, well developed in no acute distress NECK: No JVD; No carotid bruits CARDIAC: RRR, no murmurs, rubs, gallops RESPIRATORY:  Clear to auscultation without rales, wheezing or rhonchi  ABDOMEN: Soft, non-tender, non-distended EXTREMITIES:  No edema; No deformity   ASSESSMENT AND PLAN: .    Palpitation: Symptom has been going on for several months.  Patient was unable to tell me the characteristic with the palpitations even with translator service.  He could not describe to me whether the symptom is skipped heartbeat or prolonged tachypalpitations.  I recommended a trial of medication treatment with aspirin and metoprolol to tartrate 25 mg twice a day  Chest discomfort: The chest discomfort has been going on for 4 months.  It occurs both at rest and with exertion.  Sometimes occur with walking, other times occur with lifting or stretching.  Previously, patient did not wish to undergo any invasive study.  Given the atypical nature, I recommended a trial of medication therapy including aspirin and metoprolol.  I would have low very low threshold of ordering a stress testing or coronary CTA if his symptoms persist.  Hypertension: Add metoprolol  tartrate to his medical regimen  Hyperlipidemia: On Lipitor.  COVID-19 infection: Recently presented to the hospital a week ago due to COVID-19 infection.       Dispo: Follow-up on September 3rd  Signed, [place], [place]

## 2022-10-20 ENCOUNTER — Ambulatory Visit: Payer: Medicaid Other | Attending: Physician Assistant | Admitting: Physician Assistant

## 2022-10-20 ENCOUNTER — Encounter: Payer: Self-pay | Admitting: Family Medicine

## 2022-10-20 ENCOUNTER — Other Ambulatory Visit: Payer: Self-pay | Admitting: Physician Assistant

## 2022-10-20 VITALS — BP 150/76 | HR 71 | Ht 64.0 in | Wt 157.8 lb

## 2022-10-20 DIAGNOSIS — R079 Chest pain, unspecified: Secondary | ICD-10-CM | POA: Diagnosis not present

## 2022-10-20 DIAGNOSIS — I10 Essential (primary) hypertension: Secondary | ICD-10-CM

## 2022-10-20 DIAGNOSIS — R002 Palpitations: Secondary | ICD-10-CM

## 2022-10-20 DIAGNOSIS — E782 Mixed hyperlipidemia: Secondary | ICD-10-CM

## 2022-10-20 DIAGNOSIS — U071 COVID-19: Secondary | ICD-10-CM

## 2022-10-20 MED ORDER — METOPROLOL TARTRATE 25 MG PO TABS: 25.0000 mg | ORAL_TABLET | Freq: Two times a day (BID) | ORAL | 3 refills | Status: DC

## 2022-10-20 NOTE — Patient Instructions (Signed)
Medication Instructions:  START aspirin 81mg  daily   START metoprolol tartrate 25mg  twice daily  *If you need a refill on your cardiac medications before your next appointment, please call your pharmacy*   Follow-Up: At [facility], you and your health needs are our priority.  As part of our continuing mission to provide you with exceptional heart care, we have created designated Provider Care Teams.  These Care Teams include your primary Cardiologist (physician) and Advanced Practice Providers (APPs -  Physician Assistants and Nurse Practitioners) who all work together to provide you with the care you need, when you need it.  We recommend signing up for the patient portal called "MyChart".  Sign up information is provided on this After Visit Summary.  MyChart is used to connect with patients for Virtual Visits (Telemedicine).  Patients are able to view lab/test results, encounter notes, upcoming appointments, etc.  Non-urgent messages can be sent to your provider as well.   To learn more about what you can do with MyChart, go to [email or web address].    Your next appointment:    11/10/22 -- 10:55am  Azalee Course, PA

## 2022-11-10 ENCOUNTER — Ambulatory Visit (INDEPENDENT_AMBULATORY_CARE_PROVIDER_SITE_OTHER): Payer: Medicaid Other | Admitting: Sports Medicine

## 2022-11-10 ENCOUNTER — Encounter: Payer: Self-pay | Admitting: Sports Medicine

## 2022-11-10 ENCOUNTER — Ambulatory Visit: Payer: Medicaid Other | Admitting: Physician Assistant

## 2022-11-10 VITALS — BP 126/80 | HR 68 | Temp 97.8°F | Ht 64.0 in | Wt 158.0 lb

## 2022-11-10 DIAGNOSIS — I10 Essential (primary) hypertension: Secondary | ICD-10-CM | POA: Diagnosis not present

## 2022-11-10 DIAGNOSIS — R0609 Other forms of dyspnea: Secondary | ICD-10-CM | POA: Diagnosis not present

## 2022-11-10 NOTE — Progress Notes (Signed)
Careteam: Patient Care Team: Venita Sheffield, MD as PCP - General (Internal Medicine) Runell Gess, MD as PCP - Cardiology (Cardiology)  PLACE OF SERVICE:  [facility]  Advanced Directive information    No Known Allergies  Chief Complaint  Patient presents with   New Patient (Initial Visit)    Patient is being seen to establish care , Discuss heart issues ,      HPI: Patient is a 89 y.o. Todd [name] is here to establish care, previously followed by Dr Todd Todd [name]  Accompanied by his daughter  Used spanish interpreter Daughter states that they are confused about today's appt and thought they were going to see cardiologist. She reports that he is travelling to [place] this Friday and will return to [place] states in [place].  She reports that his dad has heart problems and some times when he is resting he is SOB and tired. Pt denies chest pain, palpitations, SOB , dizzy or lightheadedness, abdominal pain, nausea, vomiting, dysuria, hematuria, bloody or dark stools.    Review of Systems:  Review of Systems  Constitutional:  Negative for chills and fever.  HENT:  Negative for congestion and sore throat.   Eyes:  Negative for double vision.  Respiratory:  Negative for cough, sputum production and shortness of breath.   Cardiovascular:  Negative for chest pain, palpitations and leg swelling.  Gastrointestinal:  Negative for abdominal pain, heartburn and nausea.  Genitourinary:  Negative for dysuria, frequency and hematuria.  Musculoskeletal:  Negative for falls and myalgias.  Neurological:  Negative for dizziness, sensory change and focal weakness.    Past Medical History:  Diagnosis Date   GERD (gastroesophageal reflux disease)    Hyperlipidemia    Hypertension    History reviewed. No pertinent surgical history. Social History:   reports that he has quit smoking. His smoking use included cigarettes. He has never used smokeless tobacco. He reports that he does not  drink alcohol and does not use drugs.  Family History  Problem Relation Age of Onset   Arthritis Mother    Heart disease Brother    Heart disease Brother    Heart disease Brother    Heart disease Brother    Heart disease Brother    Hyperlipidemia Son     Medications: Patient's Medications  New Prescriptions   No medications on file  Previous Medications   AMLODIPINE (NORVASC) 5 MG TABLET    Take 1 tablet (5 mg total) by mouth daily.   AMOXICILLIN-CLAVULANATE (AUGMENTIN) 875-125 MG TABLET    Take 1 tablet by mouth every 12 (twelve) hours.   ASPIRIN EC 81 MG TABLET    Take 81 mg by mouth daily. Swallow whole.   ATORVASTATIN (LIPITOR) 10 MG TABLET    Take 1 tablet (10 mg total) by mouth daily.   BENZONATATE (TESSALON) 100 MG CAPSULE    Take 1 capsule (100 mg total) by mouth every 8 (eight) hours.   DEXTROMETHORPHAN-GUAIFENESIN (MUCINEX DM) 30-600 MG 12HR TABLET    Take 1 tablet by mouth 2 (two) times daily.   METOPROLOL TARTRATE (LOPRESSOR) 25 MG TABLET    Take 1 tablet (25 mg total) by mouth 2 (two) times daily.  Modified Medications   No medications on file  Discontinued Medications   No medications on file    Physical Exam:  Vitals:   11/10/22 1045  BP: 126/80  Pulse: 68  Temp: 97.8 F (36.6 C)  SpO2: 97%  Weight: 158 lb (71.7 kg)  Height:  5\' 4"  (1.626 m)   Body mass index is 27.12 kg/m. Wt Readings from Last 3 Encounters:  11/10/22 158 lb (71.7 kg)  10/20/22 157 lb 12.8 oz (71.6 kg)  08/24/22 160 lb 12.8 oz (72.9 kg)    Physical Exam Constitutional:      Appearance: Normal appearance.  Cardiovascular:     Rate and Rhythm: Normal rate.     Pulses: Normal pulses.     Heart sounds: Normal heart sounds.  Pulmonary:     Effort: Pulmonary effort is normal.  Abdominal:     General: There is no distension.     Tenderness: There is no abdominal tenderness. There is no guarding or rebound.  Musculoskeletal:        General: No swelling.  Neurological:      Mental Status: He is alert.     Sensory: No sensory deficit.     Motor: No weakness.     Labs reviewed: Basic Metabolic Panel: Recent Labs    08/24/22 1556  NA 135  K 3.1*  CL 105  CO2 21*  GLUCOSE 179*  BUN 17  CREATININE 1.08  CALCIUM 7.9*   Liver Function Tests: Recent Labs    08/24/22 1556  AST 19  ALT 16  ALKPHOS 95  BILITOT 0.5  PROT 7.7  ALBUMIN 3.6   Recent Labs    08/24/22 1556  LIPASE 40   No results for input(s): "AMMONIA" in the last 8760 hours. CBC: Recent Labs    08/24/22 1556  WBC 6.1  NEUTROABS 4.0  HGB 13.8  HCT 40.9  MCV 80.4  PLT 161   Lipid Panel: No results for input(s): "CHOL", "HDL", "LDLCALC", "TRIG", "CHOLHDL", "LDLDIRECT" in the last 8760 hours. TSH: No results for input(s): "TSH" in the last 8760 hours. A1C: No results found for: "HGBA1C"   Assessment/Plan  1. Essential hypertension Bp at goal  Cont with amlodipine, metoprolol   2. Dyspnea on exertion Daughter reports that pt at times c/o SOB with exertion  He followed with cardiology last month, started on metoprolol for palpitations Pt denies chest pain, palpitations, SOB at this time Instructed them to follow up with cardiology and go to ED if develops acute chest pain    Return in about 4 months (around 03/12/2023).:

## 2022-11-18 ENCOUNTER — Ambulatory Visit: Payer: Medicaid Other | Attending: Physician Assistant | Admitting: Physician Assistant

## 2022-11-18 NOTE — Progress Notes (Unsigned)
This encounter was created in error - please disregard.

## 2023-03-09 ENCOUNTER — Encounter: Payer: Self-pay | Admitting: Sports Medicine

## 2023-03-12 NOTE — Progress Notes (Signed)
 This encounter was created in error - please disregard.

## 2023-03-22 ENCOUNTER — Other Ambulatory Visit: Payer: Self-pay

## 2023-03-22 ENCOUNTER — Observation Stay (HOSPITAL_COMMUNITY)
Admission: EM | Admit: 2023-03-22 | Discharge: 2023-03-24 | Disposition: A | Discharge status: Home / Self Care | Payer: BLUE CROSS/BLUE SHIELD | Attending: Internal Medicine | Admitting: Internal Medicine

## 2023-03-22 ENCOUNTER — Emergency Department (HOSPITAL_COMMUNITY): Payer: BLUE CROSS/BLUE SHIELD

## 2023-03-22 ENCOUNTER — Encounter (HOSPITAL_COMMUNITY): Payer: Self-pay | Admitting: Emergency Medicine

## 2023-03-22 DIAGNOSIS — I639 Cerebral infarction, unspecified: Principal | ICD-10-CM

## 2023-03-22 DIAGNOSIS — R531 Weakness: Principal | ICD-10-CM

## 2023-03-22 DIAGNOSIS — Z7901 Long term (current) use of anticoagulants: Secondary | ICD-10-CM | POA: Diagnosis not present

## 2023-03-22 DIAGNOSIS — E042 Nontoxic multinodular goiter: Secondary | ICD-10-CM | POA: Diagnosis not present

## 2023-03-22 DIAGNOSIS — I63431 Cerebral infarction due to embolism of right posterior cerebral artery: Secondary | ICD-10-CM | POA: Diagnosis not present

## 2023-03-22 DIAGNOSIS — Z8639 Personal history of other endocrine, nutritional and metabolic disease: Secondary | ICD-10-CM | POA: Insufficient documentation

## 2023-03-22 DIAGNOSIS — Z79899 Other long term (current) drug therapy: Secondary | ICD-10-CM | POA: Insufficient documentation

## 2023-03-22 DIAGNOSIS — Z87891 Personal history of nicotine dependence: Secondary | ICD-10-CM | POA: Diagnosis not present

## 2023-03-22 DIAGNOSIS — I10 Essential (primary) hypertension: Secondary | ICD-10-CM

## 2023-03-22 DIAGNOSIS — R202 Paresthesia of skin: Secondary | ICD-10-CM | POA: Diagnosis present

## 2023-03-22 LAB — I-STAT CHEM 8, ED
BUN: 15 mg/dL (ref 8–23)
Calcium, Ion: 1.13 mmol/L — ABNORMAL LOW (ref 1.15–1.40)
Chloride: 104 mmol/L (ref 98–111)
Creatinine, Ser: 1.1 mg/dL (ref 0.61–1.24)
Glucose, Bld: 114 mg/dL — ABNORMAL HIGH (ref 70–99)
HCT: 46 % (ref 39.0–52.0)
Hemoglobin: 15.6 g/dL (ref 13.0–17.0)
Potassium: 3.9 mmol/L (ref 3.5–5.1)
Sodium: 142 mmol/L (ref 135–145)
TCO2: 26 mmol/L (ref 22–32)

## 2023-03-22 LAB — COMPREHENSIVE METABOLIC PANEL
ALT: 18 U/L (ref 0–44)
AST: 24 U/L (ref 15–41)
Albumin: 3.9 g/dL (ref 3.5–5.0)
Alkaline Phosphatase: 103 U/L (ref 38–126)
Anion gap: 9 (ref 5–15)
BUN: 13 mg/dL (ref 8–23)
CO2: 27 mmol/L (ref 22–32)
Calcium: 8.9 mg/dL (ref 8.9–10.3)
Chloride: 104 mmol/L (ref 98–111)
Creatinine, Ser: 1.11 mg/dL (ref 0.61–1.24)
GFR, Estimated: >60 mL/min (ref >60)
Glucose, Bld: 114 mg/dL — ABNORMAL HIGH (ref 70–99)
Potassium: 4 mmol/L (ref 3.5–5.1)
Sodium: 140 mmol/L (ref 135–145)
Total Bilirubin: 0.8 mg/dL (ref 0.0–1.2)
Total Protein: 8 g/dL (ref 6.5–8.1)

## 2023-03-22 LAB — CBC
HCT: 45.5 % (ref 39.0–52.0)
Hemoglobin: 14.6 g/dL (ref 13.0–17.0)
MCH: 26.8 pg (ref 26.0–34.0)
MCHC: 32.1 g/dL (ref 30.0–36.0)
MCV: 83.5 fL (ref 80.0–100.0)
Platelets: 185 10*3/uL (ref 150–400)
RBC: 5.45 MIL/uL (ref 4.22–5.81)
RDW: 13.3 % (ref 11.5–15.5)
WBC: 7.3 10*3/uL (ref 4.0–10.5)
nRBC: 0 % (ref 0.0–0.2)

## 2023-03-22 LAB — DIFFERENTIAL
Abs Immature Granulocytes: 0.02 10*3/uL (ref 0.00–0.07)
Basophils Absolute: 0.1 10*3/uL (ref 0.0–0.1)
Basophils Relative: 1 %
Eosinophils Absolute: 0.1 10*3/uL (ref 0.0–0.5)
Eosinophils Relative: 1 %
Immature Granulocytes: 0 %
Lymphocytes Relative: 19 %
Lymphs Abs: 1.4 10*3/uL (ref 0.7–4.0)
Monocytes Absolute: 0.5 10*3/uL (ref 0.1–1.0)
Monocytes Relative: 6 %
Neutro Abs: 5.3 10*3/uL (ref 1.7–7.7)
Neutrophils Relative %: 73 %

## 2023-03-22 LAB — APTT: aPTT: 30 s (ref 24–36)

## 2023-03-22 LAB — BRAIN NATRIURETIC PEPTIDE: B Natriuretic Peptide: 69.3 pg/mL (ref 0.0–100.0)

## 2023-03-22 LAB — PROTIME-INR
INR: 1.1 (ref 0.8–1.2)
Prothrombin Time: 13.9 s (ref 11.4–15.2)

## 2023-03-22 LAB — ETHANOL: Alcohol, Ethyl (B): <10 mg/dL (ref <10)

## 2023-03-22 MED ORDER — IOHEXOL 350 MG/ML SOLN
75.0000 mL | Freq: Once | INTRAVENOUS | Status: AC | PRN
  Administered 2023-03-22: 75 mL via INTRAVENOUS

## 2023-03-22 MED ORDER — ENOXAPARIN SODIUM 40 MG/0.4ML IJ SOSY
40.0000 mg | PREFILLED_SYRINGE | INTRAMUSCULAR | Status: DC
  Administered 2023-03-23 – 2023-03-24 (×2): 40 mg via SUBCUTANEOUS
  Filled 2023-03-22 (×2): qty 0.4

## 2023-03-22 MED ORDER — SODIUM CHLORIDE 0.9% FLUSH: 3.0000 mL | Freq: Once | INTRAVENOUS | Status: DC

## 2023-03-22 MED ORDER — STROKE: EARLY STAGES OF RECOVERY BOOK
Freq: Once | Status: AC
  Filled 2023-03-22 (×3): qty 1

## 2023-03-22 NOTE — Consult Note (Signed)
 NEUROLOGY CONSULT NOTE   Date of service: March 22, 2023 Patient Name: Todd [name] MRN:  [id number] DOB:  03/10/1930 Chief Complaint: R leg weakness and numbness Requesting Provider: Lovie Clarity, MD  History of Present Illness  Todd [name] is a 89 y.o. male with past medical history of hyperlipidemia who presents with episode of leg numbness.  Patient reports that yesterday around 2100, he got up from the bed to use the bathroom he could not feel his foot on the floor.  He was having trouble walking and so he went back to bed.  In the morning, he woke up and was sitting at the edge of the bed and had trouble feeling right half of his body.  His daughter came in and helped stand up and noticed that he was swaying left to right.  He went to bed last night at 1900 and that is usual for him as he tries to get up early in the morning.  His symptoms resolved by the time he came into the ED.  In the ED, he had workup with CT head without contrast which was negative for any acute intracranial abnormalities.  He had further workup with MRI of the brain which demonstrated subtle diffusion restriction right hippocampus but also in the right occipital lobe.  These were concerning for artifact versus acute infarct.  He had further evaluation with CT angio head and neck which demonstrated complete occlusion of the right PCA P2 segment with reconstitution of the mid to distal right P2.  Neurology was consulted further evaluation workup.  On my evaluation, patient reports R leg weakness and numbness has resolved but he has inconsistent responses only a few times to visual field testing in the left Hemi visual field. However, difficult to evaluate with his advanced age and language barrier. It is unclear when the visual field deficit started and the deficit seems very mild.  He quit smoking 30 years ago and does not drink alcohol.  LKW: 1900 on 03/20/22. Modified rankin score: 3-Moderate  disability-requires help but walks WITHOUT assistance IV Thrombolysis: not offered, outsided window. EVT: not offered, outside window  NIHSS components Score: Comment  1a Level of Conscious 0[x]  1[]  2[]  3[]      1b LOC Questions 0[]  1[x]  2[]     Thinks he is 79.  1c LOC Commands 0[x]  1[]  2[]       2 Best Gaze 0[x]  1[]  2[]       3 Visual 0[]  1[x]  2[]  3[]      4 Facial Palsy 0[x]  1[]  2[]  3[]      5a Motor Arm - left 0[x]  1[]  2[]  3[]  4[]  UN[]    5b Motor Arm - Right 0[x]  1[]  2[]  3[]  4[]  UN[]    6a Motor Leg - Left 0[x]  1[]  2[]  3[]  4[]  UN[]    6b Motor Leg - Right 0[x]  1[]  2[]  3[]  4[]  UN[]    7 Limb Ataxia 0[x]  1[]  2[]  3[]  UN[]     8 Sensory 0[x]  1[]  2[]  UN[]      9 Best Language 0[x]  1[]  2[]  3[]      10 Dysarthria 0[x]  1[]  2[]  UN[]      11 Extinct. and Inattention 0[x]  1[]  2[]       TOTAL: 2      ROS  Comprehensive ROS performed and pertinent positives documented in HPI   Past History  History reviewed. No pertinent past medical history.  History reviewed. No pertinent surgical history.  Family History: History reviewed. No pertinent family history.  Social History  reports that he has  never smoked. He has never used smokeless tobacco. No history on file for alcohol use and drug use.  No Known Allergies  Medications   Current Facility-Administered Medications:    [START ON 03/23/2023] enoxaparin  (LOVENOX ) injection 40 mg, 40 mg, Subcutaneous, Q24H, Jolaine Pac, DO   sodium chloride  flush (NS) 0.9 % injection 3 mL, 3 mL, Intravenous, Once, Mannie Pac T, DO  Current Outpatient Medications:    atorvastatin  (LIPITOR) 10 MG tablet, Take 10 mg by mouth daily., Disp: , Rfl:    metoprolol  tartrate (LOPRESSOR ) 25 MG tablet, Take 25 mg by mouth 2 (two) times daily., Disp: , Rfl:   Vitals   Vitals:   2023-04-09 1630 04/09/23 1730 2023-04-09 1800 April 09, 2023 1850  BP: (!) 167/100 (!) 179/78 (!) 160/89   Pulse: 66 79 82   Resp: (!) 24 (!) 31 (!) 22   Temp:    98.2 F (36.8 C)  TempSrc:     Oral  SpO2: 100% 99% 100%     There is no height or weight on file to calculate BMI.  Physical Exam   General: Laying comfortably in bed; in no acute distress.  HENT: Normal oropharynx and mucosa. Normal external appearance of ears and nose.  Neck: Supple, no pain or tenderness  CV: No JVD. No peripheral edema.  Pulmonary: Symmetric Chest rise. Normal respiratory effort.  Abdomen: Soft to touch, non-tender.  Ext: No cyanosis, edema, or deformity  Skin: No rash. Normal palpation of skin.   Musculoskeletal: Normal digits and nails by inspection. No clubbing.   Neurologic Examination  Mental status/Cognition: Alert, oriented to self, place, month and year, good attention.  Speech/language: Fluent, comprehension intact, object naming intact, repetition intact.  Cranial nerves:   CN II Pupils equal and reactive to light, inconsistent response to visual field testing in the left Hemi visual field.   CN III,IV,VI EOM intact, no gaze preference or deviation, no nystagmus    CN V normal sensation in V1, V2, and V3 segments bilaterally    CN VII no asymmetry, no nasolabial fold flattening    CN VIII normal hearing to speech    CN IX & X normal palatal elevation, no uvular deviation    CN XI 5/5 head turn and 5/5 shoulder shrug bilaterally    CN XII midline tongue protrusion    Motor:  Muscle bulk: normal, tone normal, pronator drift none tremor none Mvmt Root Nerve  Muscle Right Left Comments  SA C5/6 Ax Deltoid 5 5   EF C5/6 Mc Biceps 5 5   EE C6/7/8 Rad Triceps 5 5   WF C6/7 Med FCR     WE C7/8 PIN ECU     F Ab C8/T1 U ADM/FDI 5 5   HF L1/2/3 Fem Illopsoas 5 4+   KE L2/3/4 Fem Quad 5 5   DF L4/5 D Peron Tib Ant 5 5   PF S1/2 Tibial Grc/Sol 5 5    Sensation:  Light touch Intact throughout   Pin prick    Temperature    Vibration   Proprioception    Coordination/Complex Motor:  - Finger to Nose intact bilaterally - Heel to shin intact bilaterally - Rapid alternating  movement are slowed throughout - Gait: Deferred for patient's safety. Labs/Imaging/Neurodiagnostic studies   CBC:  Recent Labs  Lab 2023/04/09 1120 04-09-23 1220  WBC  --  7.3  NEUTROABS  --  5.3  HGB 15.6 14.6  HCT 46.0 45.5  MCV  --  83.5  PLT  --  185   Basic Metabolic Panel:  Lab Results  Component Value Date   NA 140 03/22/2023   K 4.0 03/22/2023   CO2 27 03/22/2023   GLUCOSE 114 (H) 03/22/2023   BUN 13 03/22/2023   CREATININE 1.11 03/22/2023   CALCIUM  8.9 03/22/2023   GFRNONAA >60 03/22/2023   Lipid Panel: No results found for: LDLCALC HgbA1c: No results found for: HGBA1C Urine Drug Screen: No results found for: LABOPIA, COCAINSCRNUR, LABBENZ, AMPHETMU, THCU, LABBARB  Alcohol Level     Component Value Date/Time   ETH <10 03/22/2023 1115   INR  Lab Results  Component Value Date   INR 1.1 03/22/2023   APTT  Lab Results  Component Value Date   APTT 30 03/22/2023   AED levels: No results found for: PHENYTOIN, ZONISAMIDE, LAMOTRIGINE, LEVETIRACETA  CT Head without contrast(Personally reviewed): CTH was negative for a large hypodensity concerning for a large territory infarct or hyperdensity concerning for an ICH  CT angio Head and Neck with contrast(Personally reviewed): Right PCA P2 occlusion.  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 male with past medical history of hyperlipidemia who presents with episode of R leg numbness that spontaneously resolved. However, exam with inconsistent responses to visual field testing in L hemivisual field at times but intact at other times with slight L hip flexion weakness. Imaging with ?small R occipital stroke and R small Hippocampus stroke. CTA with Right PCA P2 occlusion.  Presentation concerning for a TIA earlier with R sided symptoms and a R PCA stroke with R PCA P2 occlusion with mild deficits  only.  He was outisde tnkase and thrombectomy window and deficit is too mild at this time.  RECOMMENDATIONS  - Frequent Neuro checks per stroke unit protocol - Recommend obtaining TTE  - Recommend obtaining Lipid panel with LDL - Please start statin if LDL > 70 - Recommend HbA1c to evaluate for diabetes and how well it is controlled. - Antithrombotic - Aspirin  325mg  PO once along with plavix  300mg  PO once, followed by aspirin  81 mg daily along with Plavix  75 mg daily starting tomorrow for 20 days, followed by aspirin  81 mg daily alone. - Recommend DVT ppx - SBP goal - permissive hypertension first 24 h < 220/110. Held home meds.  - Recommend Telemetry monitoring for arrythmia - Recommend bedside swallow screen prior to PO intake. - Stroke education booklet - Recommend PT/OT/SLP consult - stroke team to follow.  ______________________________________________________________________   Signed, Lasasha Brophy, MD [facility]

## 2023-03-22 NOTE — ED Notes (Signed)
 Pt ambulated to bathroom with family at side.

## 2023-03-22 NOTE — ED Notes (Signed)
 Pt provided with peanut butter and crackers per EDP approval.

## 2023-03-22 NOTE — H&P (Addendum)
 Date: 03/22/2023         Patient Name:  Todd [name] MRN: [id number]  DOB: [birth year] Age / Sex: 89 y.o., male   PCP: [facility]         Medical Service: Internal Medicine Teaching Service         Attending Physician: Dr. Lovie Clarity, MD    First Contact: Dr. Missy Sandhoff, MD Pager [phone] Pager: [phone]  Second Contact: Dr. Ozell Nearing, DO Pager [phone] Pager: [phone]       After Hours (After 5p/  First Contact Pager: [phone]  weekends / holidays): Second Contact Pager: [phone]   Chief Concern: Right sided arm and leg numbness for the last 24 hours   History of Present Illness: Mr Todd [name] is a 89 year old male with a remote history of CVA in 2014 who presented to the emergency department due to concerns for right arm and leg weakness for the past 24 hours.  Patient lives in [place] visiting family here in [place].Patient is Spanish-speaking and the majority of the history is translated by his granddaughter who lives in [place]. Reports he was laying in bed and got up to use the bathroom ,when he noticed complete numbness and weakness on the left side of his body.  Says it felt like the right side of his body was asleep.  Patient reports the numbness felt similar to symptoms he had prior to being diagnosed with a stroke in 2014.  He later called his wife and asked to be brought into the ED.  Patient denies any other medical history and didn't bring any medical records from his home country.  Review of Systems  Constitutional:  Negative for chills, diaphoresis, fever, malaise/fatigue and weight loss.  Eyes:  Negative for blurred vision, double vision, photophobia and pain.  Cardiovascular:  Negative for chest pain, palpitations and orthopnea.       Denies any claudication or leg swelling.  Neurological:        Reported mild dizziness yesterday but has since resolved since this admission.    Allergies: No Known Allergies  Past  Medical History: Reports CVA in 2014 in [place]   Medications:  Lipitor 10 mg daily Lopressor  25 mg BID   Surgical History: History reviewed. No pertinent surgical history.  Family History:  History reviewed. No pertinent family history.  Social History:  Lives in the [place] but his wife.  Retired from farm work.  Denies any current or previous history of alcohol or any illicit drugs.  Quit smoking cigarettes in over 30 years ago.  Elected his son Emil as his management consultant  Physical Exam:  Blood pressure (!) 160/89, pulse 82, temperature 98.2 F (36.8 C), temperature source Oral, resp. rate (!) 22, SpO2 100%.  Constitutional: Well-appearing Spanish-speaking man, laying in bed, in no acute distress HENT: normocephalic atraumatic, mucous membranes moist Cardiovascular: regular rate and rhythm, no m/r/g, no JVD Pulmonary/Chest: normal work of breathing on room air, lungs clear to auscultation bilaterally.  No crackles  Abdominal: soft, non-tender, non-distended Mental Status: Patient is awake, alert, oriented x3 No signs of aphasia or neglect Cranial Nerves: II: Pupils equal, round, and reactive to light.   III,IV, VI: EOMI without ptosis or diploplia.  V: Facial sensation is symmetric to light touch and temperature. VII: Facial movement is symmetric.  VIII: Hearing is intact to voice X: Uvula elevates symmetrically XI: Shoulder shrug is symmetric. XII: Tongue is midline without atrophy or  fasciculations.  Motor: Great effort thorughout, at least 5/5 bilateral UE, 5/5 bilateral LE Sensory: Sensation is grossly intact both bilateral UE & LE MSK: no gross abnormalities. No pitting edema. Skin: warm and dry. Psych: Normal mood and affect   Labs:    Latest Ref Rng & Units 03/22/2023   12:20 PM 03/22/2023   11:20 AM  CBC  WBC 4.0 - 10.5 K/uL 7.3    Hemoglobin 13.0 - 17.0 g/dL 85.3  84.3   Hematocrit 39.0 - 52.0 % 45.5  46.0   Platelets 150 - 400 K/uL 185          Latest Ref Rng & Units 03/22/2023   12:20 PM 03/22/2023   11:20 AM  CMP  Glucose 70 - 99 mg/dL 885  885   BUN 8 - 23 mg/dL 13  15   Creatinine 9.38 - 1.24 mg/dL 8.88  8.89   Sodium 864 - 145 mmol/L 140  142   Potassium 3.5 - 5.1 mmol/L 4.0  3.9   Chloride 98 - 111 mmol/L 104  104   CO2 22 - 32 mmol/L 27    Calcium  8.9 - 10.3 mg/dL 8.9    Total Protein 6.5 - 8.1 g/dL 8.0    Total Bilirubin 0.0 - 1.2 mg/dL 0.8    Alkaline Phos 38 - 126 U/L 103    AST 15 - 41 U/L 24    ALT 0 - 44 U/L 18       Images and other studies:  Imaging: CT Angio Head Neck W WO CM Result Date: 03/22/2023  IMPRESSION: 1. Complete occlusion in the proximal right P2 segment, with reconstitution in the mid to distal right P2. Somewhat diminished opacification in the right P3 branches. 2. Severe stenosis in a bilateral proximal M2 branches. Moderate stenosis in the right M1 and mild stenosis in the left M1. 3. No hemodynamically significant stenosis in the neck. 4. Multiple hypoenhancing nodules in the thyroid, the largest of which measures up to 1.9 cm.   MR BRAIN WO CONTRAST Result Date: 03/22/2023 IMPRESSION: 1. Subtle diffuse restricted diffusion in the right hippocampus and small focus of restricted diffusion in the right occipital lobe, may represent acute infarcts. 2. Focus of susceptibility artifact in the right crural cistern in the setting of right hippocampus and occipital lobe restricted diffusion is suggestive of thrombus in the right P2/PCA segment. Correlation with CT angiogram search set of the head and neck is suggested. 3. Multiple remote infarcts as described above. 4. Moderate chronic microangiopathy.   CT HEAD WO CONTRAST Result Date: 03/22/2023 IMPRESSION: 1. No acute intracranial pathology. 2. Moderate age-related atrophy and chronic microvascular ischemic changes.    Assessment & Plan:  Todd [name] is a 89 y.o. male with a history of hyperlipidemia and prior CVA who presented due to  right arm and leg numbness and weakness and admitted for acute right-sided weakness workup.  Principal Problem:   Acute right-sided weakness  #Acute right-sided weakness This is a 89 year old male with a history of prior CVA in 2014 presenting with acute right-sided weakness and numbness over the past 24 hours.  Differentials of unilateral side weakness could include hemorrhagic stroke versus ischemic ischemic stroke versus intracerebral hemorrhage.  CT head was negative for large hypodensity concerning for a large territory infarct or hypodensity concerning for intracranial hemorrhage.  Follow-up MRI of the brain showed a septal diffusion restriction right hippocampus in the right occipital lobe, however , its chronicity is indeterminate.  On exam post  admission, all reported neurological deficit had resolved.  This presentation of  episode neurological dysfunction that seems to have resolved within a day with no definite acute infract noted on imaging makes me worry if this patient had a transient ischemic attack.No clear source identified at this time. Will consider TTE to evaluate for potential cardiac source Plan :  - Neurology recommendations appreciated - Frequent neuro checks - PT/OT - Lipid panel  - Hemoglobin A1c -  DAPT  - TTE  ??  # Hx of hyperlipidemia On Lipitor 10 mg.  No documented lipid panel on file. Due to history of CVA and the risk involved developing another event, patient will benefit from guided statin therapy to minimize his risk.  Will order lipid panel and adjust statin medication if needed. Plan: - order lipid panel - Continue Lipitor 10 mg   Incidental findings #Multiple hypoenhancing thyroid  nodules  If this has not previously been evaluated, a non-emergent ultrasound of the thyroid could be obtained, with consideration given to the patient's age and comorbidities.  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 Will continue to monitor    Level of care: Telemetry Medical  Diet: Regular IVF: N/A VTE: enoxaparin  (LOVENOX ) injection 40 mg Start: 03/23/23 1000 Code: FULL CODE  Surrogate: Son, Emil   Signed: Drue Grow, MD 03/22/2023, 10:35 PM

## 2023-03-22 NOTE — ED Triage Notes (Signed)
 Pt reports right sided arm and leg numbness and tingling that started last night at 2100. Pt reports he is able to feel his arm and leg when they are touched. No other neuro deficits.

## 2023-03-22 NOTE — Hospital Course (Addendum)
#  Acute PCA stroke Patient presented to the ED after development of acute right sided weakness. Symptoms had resolved by time of assessment in the ED. 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 Lipid profile showed LDL 78. A1c was 5.8. TTE showed normal LV, RV, and valvular function without shunting. Neurology recommended 30 day cardiac monitoring after discharge as well as DAPT for 3 months followed by aspirin  81mg  indefinitely.   #Hypertension Patient takes metoprolol  25mg  BID at home. Antihypertensive was held during admission to allow for permissive hypertension, resumed at discharge.   #Hyperlipidemia Patient takes atorvastatin  10mg  at home. Lipid profile was repeated showing LDL of 78. Atorvastatin  dose increased to 40mg .   #Thyroid nodules Incidental finding of multiple hypoenhancing nodules in the thyroid. Recommend non-emergent ultrasound to further evaluate.

## 2023-03-22 NOTE — ED Provider Notes (Signed)
 [facility] Provider Note   CSN: [id number] Arrival date & time: 03/22/23  1028     History  Chief Complaint  Patient presents with   Numbness    Todd [name] is a 89 y.o. male history of CVA in 2014 presenting with right sided paresthesias to arm and leg that began last night at 9 PM.  Patient denies vision changes or headache or neck pain but is unable to stand due to weakness of his right leg.  Patient denies chest pain or shortness of breath.  Patient denies fevers.  Patient was brought here by family to have assessed for possible stroke as he is not currently on any blood thinners besides aspirin .  Home Medications Prior to Admission medications   Not on File      Allergies    Patient has no known allergies.    Review of Systems   Review of Systems  Physical Exam Updated Vital Signs BP (!) 171/90   Pulse 61   Temp 98.3 F (36.8 C) (Oral)   Resp (!) 21   SpO2 96%  Physical Exam Vitals reviewed.  Constitutional:      General: He is not in acute distress. HENT:     Head: Normocephalic and atraumatic.  Eyes:     Extraocular Movements: Extraocular movements intact.     Conjunctiva/sclera: Conjunctivae normal.     Pupils: Pupils are equal, round, and reactive to light.  Cardiovascular:     Rate and Rhythm: Normal rate and regular rhythm.     Pulses: Normal pulses.     Heart sounds: Normal heart sounds.     Comments: 2+ bilateral radial/dorsalis pedis pulses with regular rate Pulmonary:     Effort: Pulmonary effort is normal. No respiratory distress.     Breath sounds: Normal breath sounds.  Abdominal:     Palpations: Abdomen is soft.     Tenderness: There is no abdominal tenderness. There is no guarding or rebound.  Musculoskeletal:        General: Normal range of motion.     Cervical back: Normal range of motion and neck supple.     Comments: 5 out of 5 bilateral grip/leg extension strength  Skin:     General: Skin is warm and dry.     Capillary Refill: Capillary refill takes less than 2 seconds.  Neurological:     Mental Status: He is alert and oriented to person, place, and time.     Comments: Sensation intact in all 4 limbs Cranial nerves III through XII intact Vision grossly intact Able to bear weight but cannot take steps as his right leg is weak 5 out of 5 bilateral shoulder adduction/abduction, elbow flexion/extension, grip strength  Psychiatric:        Mood and Affect: Mood normal.     ED Results / Procedures / Treatments   Labs (all labs ordered are listed, but only abnormal results are displayed) Labs Reviewed  COMPREHENSIVE METABOLIC PANEL - Abnormal; Notable for the following components:      Result Value   Glucose, Bld 114 (*)    All other components within normal limits  I-STAT CHEM 8, ED - Abnormal; Notable for the following components:   Glucose, Bld 114 (*)    Calcium , Ion 1.13 (*)    All other components within normal limits  PROTIME-INR  APTT  CBC  DIFFERENTIAL  ETHANOL  CBG MONITORING, ED    EKG None  Radiology CT  HEAD WO CONTRAST Result Date: 03/22/2023 CLINICAL DATA:  Neurologic deficit.  Stroke suspected. EXAM: CT HEAD WITHOUT CONTRAST TECHNIQUE: Contiguous axial images were obtained from the base of the skull through the vertex without intravenous contrast. RADIATION DOSE REDUCTION: This exam was performed according to the departmental dose-optimization program which includes automated exposure control, adjustment of the mA and/or kV according to patient size and/or use of iterative reconstruction technique. COMPARISON:  None Available. FINDINGS: Brain: Moderate age-related atrophy and chronic microvascular ischemic changes. There is no acute intracranial hemorrhage. No mass effect or midline shift. No extra-axial fluid collection. Vascular: No hyperdense vessel or unexpected calcification. Skull: Normal. Negative for fracture or focal lesion.  Sinuses/Orbits: No acute finding. Other: None IMPRESSION: 1. No acute intracranial pathology. 2. Moderate age-related atrophy and chronic microvascular ischemic changes. Electronically Signed   By: Vanetta Chou M.D.   On: 03/22/2023 13:09    Procedures .Critical Care  Performed by: Victor Lynwood DASEN, PA-C Authorized by: Victor Lynwood DASEN, PA-C   Critical care provider statement:    Critical care time (minutes):  30   Critical care time was exclusive of:  Separately billable procedures and treating other patients   Critical care was necessary to treat or prevent imminent or life-threatening deterioration of the following conditions: tPA consideration.   Critical care was time spent personally by me on the following activities:  Blood draw for specimens, development of treatment plan with patient or surrogate, discussions with consultants, examination of patient, obtaining history from patient or surrogate, review of old charts, pulse oximetry, ordering and review of radiographic studies, ordering and review of laboratory studies, ordering and performing treatments and interventions and evaluation of patient's response to treatment   I assumed direction of critical care for this patient from another provider in my specialty: no     Care discussed with comment:  Radiology     Medications Ordered in ED Medications  sodium chloride  flush (NS) 0.9 % injection 3 mL (has no administration in time range)    ED Course/ Medical Decision Making/ A&P                                 Medical Decision Making Amount and/or Complexity of Data Reviewed Labs: ordered. Radiology: ordered.   Todd [name] 89 y.o. presented today for paresthesias/weakness. Working DDx that I considered at this time includes, but not limited to, CVA/TIA, electrolyte abnormality, anemia,, epidural/subdural hematoma, ICH, peripheral neuropathy.  R/o DDx: pending  Review of prior external notes: None  Unique Tests  and My Interpretation:  CBC: Unremarkable Vasectomy: Unremarkable PT/INR: Unremarkable Ethanol: Unremarkable aPTT: Unremarkable CMP: Unremarkable CT head without contrast: No acute changes EKG: Sinus 60 bpm, no ST elevations or depressions noted, no blocks noted MRI brain without contrast:  Social Determinants of Health: none  Discussion with Independent Historian:  Daughter and wife  Discussion of Management of Tests:  Radiology  Risk: High: hospitalization or escalation of hospital-level care  Risk Stratification Score: None  Staffed with Mannie, DO  Plan: On exam patient was no acute distress with stable vitals.  On exam patient was able to bear weight but cannot ambulate due to weakness of his right leg but otherwise had good hip strength is neuro vastly intact in all 4 limbs.  Patient did have good right upper extremity strength as well as equal to the left upper extremity.  Patient's cranial nerve exam was also reassuring.  Due to patient not being on any blood thinners and history of CVA we will get CT along with stroke labs.  CT was negative so we will get the MR.  Radiology called saying that they suspect patient may have PCA occlusion along restricted filling of the right hippocampus and recommend CTA head and neck and so this was ordered.  Without previous imaging radiology is unable to exclude if this is artifact or infarct without CTA.  Patient is currently outside tPA window.  Patient signed out to Larna, MD.  Please review their note for the continuation of patient's care.  The plan at this point is follow-up on CTA and consulting neurology and do anticipate admission.  Patient stable at time of signout.  This chart was dictated using voice recognition software.  Despite best efforts to proofread,  errors can occur which can change the documentation meaning.         Final Clinical Impression(s) / ED Diagnoses Final diagnoses:  None    Rx / DC Orders ED  Discharge Orders     None         Victor Lynwood ONEIDA DEVONNA 03/22/23 1459    Mannie Pac T, DO 03/23/23 1023

## 2023-03-22 NOTE — ED Provider Notes (Signed)
 Patient care assumed from previous provider.   Patient care of Todd [name] is a 89 y.o. male from previous provider. Please see the original provider note from this emergency department encounter for full history and physical.   Course of Care and my assessment at the time of sign out is detailed in the ED Course below.   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  Some difficulty assessing visual fields due to patient compliance, baseline cataracts, and language barrier, however does not appear to have any obvious visual field deficit.  Otherwise appears to have normal neurologic exam.  Imaging findings of PCA occlusion are not consistent with patient's reported symptoms and patient out of the window for TNKase window and not a candidate for thrombectomy given minimal to no symptoms.  Given severe stenosis of bilateral MCAs I am concerned for possible TIA causing patient's prior right hemibody weakness.  Believe he would benefit from admission for further stroke workup and optimization with secondary stroke prevention.  Neurology team was engaged and providing recommendations, however agree with this plan.  Patient will be admitted to the internal medicine service to facilitate completion of workup and management.  Labs reviewed by myself and considered in medical decision making.  Imaging reviewed by myself and considered in medical decision making. Imaging final read interpreted by  radiology.  1. Cerebrovascular accident (CVA), unspecified mechanism [facility])      Patient will require admission for further management of stroke/TIA.  He will be admitted to the internal medicine service.  Please see inpatient provider notes for further treatment plan details.  Patient remained stable and had no acute events under my care in the emergency department  @DISPOSITION @   The plan for this patient was discussed with Dr. Glendia Breeding, who voiced agreement and who oversaw evaluation and treatment of this patient.    Larna Raring, MD 03/23/23 9948    Breeding Glendia, MD 03/26/23 [phone]

## 2023-03-23 ENCOUNTER — Other Ambulatory Visit: Payer: Self-pay | Admitting: Physician Assistant

## 2023-03-23 ENCOUNTER — Observation Stay (HOSPITAL_BASED_OUTPATIENT_CLINIC_OR_DEPARTMENT_OTHER): Payer: BLUE CROSS/BLUE SHIELD

## 2023-03-23 ENCOUNTER — Observation Stay (HOSPITAL_COMMUNITY): Payer: BLUE CROSS/BLUE SHIELD

## 2023-03-23 DIAGNOSIS — I6389 Other cerebral infarction: Secondary | ICD-10-CM

## 2023-03-23 DIAGNOSIS — I639 Cerebral infarction, unspecified: Secondary | ICD-10-CM

## 2023-03-23 DIAGNOSIS — R531 Weakness: Secondary | ICD-10-CM

## 2023-03-23 LAB — BASIC METABOLIC PANEL
Anion gap: 11 (ref 5–15)
BUN: 11 mg/dL (ref 8–23)
CO2: 24 mmol/L (ref 22–32)
Calcium: 8.8 mg/dL — ABNORMAL LOW (ref 8.9–10.3)
Chloride: 104 mmol/L (ref 98–111)
Creatinine, Ser: 1.14 mg/dL (ref 0.61–1.24)
GFR, Estimated: >60 mL/min (ref >60)
Glucose, Bld: 111 mg/dL — ABNORMAL HIGH (ref 70–99)
Potassium: 3.3 mmol/L — ABNORMAL LOW (ref 3.5–5.1)
Sodium: 139 mmol/L (ref 135–145)

## 2023-03-23 LAB — ECHOCARDIOGRAM COMPLETE BUBBLE STUDY
AR max vel: 2.46 cm2
AV Peak grad: 5.3 mm[Hg]
Ao pk vel: 1.15 m/s
Area-P 1/2: 3.37 cm2

## 2023-03-23 LAB — CBC
HCT: 43.3 % (ref 39.0–52.0)
Hemoglobin: 14.5 g/dL (ref 13.0–17.0)
MCH: 27.3 pg (ref 26.0–34.0)
MCHC: 33.5 g/dL (ref 30.0–36.0)
MCV: 81.4 fL (ref 80.0–100.0)
Platelets: 176 10*3/uL (ref 150–400)
RBC: 5.32 MIL/uL (ref 4.22–5.81)
RDW: 13.2 % (ref 11.5–15.5)
WBC: 8.6 10*3/uL (ref 4.0–10.5)
nRBC: 0 % (ref 0.0–0.2)

## 2023-03-23 LAB — LIPID PANEL
Cholesterol: 127 mg/dL (ref 0–200)
HDL: 30 mg/dL — ABNORMAL LOW (ref >40)
LDL Cholesterol: 78 mg/dL (ref 0–99)
Total CHOL/HDL Ratio: 4.2 {ratio}
Triglycerides: 93 mg/dL (ref <150)
VLDL: 19 mg/dL (ref 0–40)

## 2023-03-23 LAB — HEMOGLOBIN A1C
Hgb A1c MFr Bld: 5.8 % — ABNORMAL HIGH (ref 4.8–5.6)
Mean Plasma Glucose: 119.76 mg/dL

## 2023-03-23 MED ORDER — CLOPIDOGREL BISULFATE 75 MG PO TABS
75.0000 mg | ORAL_TABLET | Freq: Every day | ORAL | Status: DC
  Administered 2023-03-24: 75 mg via ORAL
  Filled 2023-03-23: qty 1

## 2023-03-23 MED ORDER — ASPIRIN 81 MG PO TBEC
81.0000 mg | DELAYED_RELEASE_TABLET | Freq: Every day | ORAL | Status: DC
  Administered 2023-03-24: 81 mg via ORAL
  Filled 2023-03-23: qty 1

## 2023-03-23 MED ORDER — ASPIRIN 325 MG PO TABS
325.0000 mg | ORAL_TABLET | Freq: Once | ORAL | Status: AC
  Administered 2023-03-23: 325 mg via ORAL
  Filled 2023-03-23: qty 1

## 2023-03-23 MED ORDER — ATORVASTATIN CALCIUM 40 MG PO TABS
40.0000 mg | ORAL_TABLET | Freq: Every day | ORAL | Status: DC
  Administered 2023-03-23 – 2023-03-24 (×2): 40 mg via ORAL
  Filled 2023-03-23 (×2): qty 1

## 2023-03-23 MED ORDER — ACETAMINOPHEN 325 MG PO TABS: 650.0000 mg | ORAL_TABLET | Freq: Four times a day (QID) | ORAL | Status: DC | PRN

## 2023-03-23 MED ORDER — CLOPIDOGREL BISULFATE 300 MG PO TABS
300.0000 mg | ORAL_TABLET | Freq: Once | ORAL | Status: AC
  Administered 2023-03-23: 300 mg via ORAL
  Filled 2023-03-23: qty 1

## 2023-03-23 NOTE — TOC Initial Note (Signed)
 Transition of Care [facility]) - Initial/Assessment Note    Patient Details  Name: Todd [name] MRN: [id number] Date of Birth: [birth year]  [facility] CM/SW Contact:    Andrez JULIANNA George, RN Phone Number: 03/23/2023, 4:24 PM  Clinical Narrative:                  CM met with the patient and his family. Using the interpreter, family states someone is always with him.  He doesn't use any DME. Family manages his medications and provides needed transportation. Outpatient arranged with [facility]. Information on the AVS. Family will call to schedule the first appointment. TOC following.  Expected Discharge Plan: OP Rehab Barriers to Discharge: Continued Medical Work up   Patient Goals and CMS Choice   CMS Medicare.gov Compare Post Acute Care list provided to:: Patient Represenative (must comment) Choice offered to / list presented to : Adult Children      Expected Discharge Plan and Services   Discharge Planning Services: CM Consult   Living arrangements for the past 2 months: Single Family Home                                      Prior Living Arrangements/Services Living arrangements for the past 2 months: Single Family Home Lives with:: Adult Children Patient language and need for interpreter reviewed:: Yes Do you feel safe going back to the place where you live?: Yes        Care giver support system in place?: Yes (comment)   Criminal Activity/Legal Involvement Pertinent to Current Situation/Hospitalization: No - Comment as needed  Activities of Daily Living   ADL Screening (condition at time of admission) Independently performs ADLs?: Yes (appropriate for developmental age) Is the patient deaf or have difficulty hearing?: No Does the patient have difficulty seeing, even when wearing glasses/contacts?: No Does the patient have difficulty concentrating, remembering, or making decisions?: No  Permission Sought/Granted                   Emotional Assessment Appearance:: Appears stated age         Psych Involvement: No (comment)  Admission diagnosis:  Acute right-sided weakness [R53.1] Cerebrovascular accident (CVA), unspecified mechanism (HCC) [I63.9] Patient Active Problem List   Diagnosis Date Noted   Cerebrovascular accident (CVA) (HCC) 03/23/2023   Acute right-sided weakness 03/22/2023   PCP:  [facility] Pharmacy:   [id number] - [place], [place] - [address] [place] [address] Phone: [phone] Fax: [phone]     Social Drivers of Health (SDOH) Social History: SDOH Screenings   Food Insecurity: No Food Insecurity (03/23/2023)  Housing: Low Risk  (03/23/2023)  Transportation Needs: No Transportation Needs (03/23/2023)  Utilities: Not At Risk (03/23/2023)  Social Connections: Moderately Isolated (03/23/2023)  Tobacco Use: Low Risk  (03/22/2023)   SDOH Interventions:     Readmission Risk Interventions     No data to display

## 2023-03-23 NOTE — Plan of Care (Signed)

## 2023-03-23 NOTE — Evaluation (Signed)
 Physical Therapy Evaluation Patient Details Name: Todd [name] MRN: [id number] DOB: 09-Sep-1930 Today's Date: 03/23/2023  History of Present Illness  Todd [name] is a 89 y.o. male who presents with episode of leg numbness.  CT head without contrast which was negative for any acute intracranial abnormalities.   MRI of the brain demonstrated subtle diffusion restriction right hippocampus but also in the right occipital lobe.  These were concerning for artifact versus acute infarct.  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  PMH of hyperlipidemia.   Clinical Impression  Pt admitted with above diagnosis. PTA pt lived in [place] with his wife, independent. He is in [place] visiting his daughter. Pt currently with functional limitations due to the deficits listed below (see PT Problem List). On eval, pt demo mod I bed mobility. CGA transfers and amb 165' without AD. Mildly unsteady gait but no LOB. Pt reports no pain and no sensation changes. BLE strength appears symmetrical. Left hemianopsia. Pt does report feeling very tired due to being in the ED all night. Pt will benefit from acute skilled PT to increase their independence and safety with mobility to allow discharge. PT to follow pt acutely. No follow up services or DME indicated.         If plan is discharge home, recommend the following:     Can travel by private vehicle        Equipment Recommendations None recommended by PT  Recommendations for Other Services       Functional Status Assessment Patient has had a recent decline in their functional status and demonstrates the ability to make significant improvements in function in a reasonable and predictable amount of time.     Precautions / Restrictions Precautions Precautions: Fall Precaution Comments: Left hemianopsia Restrictions Weight Bearing Restrictions Per  Provider Order: No      Mobility  Bed Mobility Overal bed mobility: Modified Independent                  Transfers Overall transfer level: Needs assistance Equipment used: None Transfers: Sit to/from Stand Sit to Stand: Contact guard assist                Ambulation/Gait Ambulation/Gait assistance: Contact guard assist Gait Distance (Feet): 165 Feet Assistive device: None Gait Pattern/deviations: Step-through pattern, Decreased stride length Gait velocity: decreased Gait velocity interpretation: 1.31 - 2.62 ft/sec, indicative of limited community ambulator   General Gait Details: Mildly unsteady but no LOB  Stairs            Wheelchair Mobility     Tilt Bed    Modified Rankin (Stroke Patients Only) Modified Rankin (Stroke Patients Only) Pre-Morbid Rankin Score: No symptoms Modified Rankin: Moderate disability     Balance Overall balance assessment: Needs assistance Sitting-balance support: No upper extremity supported, Feet supported Sitting balance-Leahy Scale: Good     Standing balance support: No upper extremity supported, During functional activity Standing balance-Leahy Scale: Fair                               Pertinent Vitals/Pain Pain Assessment Pain Assessment: No/denies pain    Home Living Family/patient expects to be discharged to:: Private residence Living Arrangements: Children (daughter) Available Help at Discharge: Available 24 hours/day;Family Type of Home: House Home Access: Stairs to enter Entrance Stairs-Rails: Lawyer of  Steps: 3   Home Layout: One level Home Equipment: None Additional Comments: Pt lives in [place]. He is in [place] visiting his daughter. Plans to return to [place] 1/21.    Prior Function Prior Level of Function : Independent/Modified Independent                     Extremity/Trunk Assessment   Upper Extremity Assessment Upper  Extremity Assessment: Defer to OT evaluation    Lower Extremity Assessment Lower Extremity Assessment: Overall WFL for tasks assessed    Cervical / Trunk Assessment Cervical / Trunk Assessment: Normal  Communication   Communication Communication: Difficulty following commands/understanding Following commands: Follows one step commands consistently Cueing Techniques: Gestural cues;Visual cues;Verbal cues  Cognition Arousal: Alert Behavior During Therapy: WFL for tasks assessed/performed Overall Cognitive Status: Difficult to assess                                          General Comments General comments (skin integrity, edema, etc.): VSS on RA    Exercises     Assessment/Plan    PT Assessment Patient needs continued PT services  PT Problem List Decreased balance;Decreased activity tolerance;Decreased mobility       PT Treatment Interventions Functional mobility training;Balance training;Patient/family education;Gait training;Therapeutic activities;Stair training    PT Goals (Current goals can be found in the Care Plan section)  Acute Rehab PT Goals Patient Stated Goal: home PT Goal Formulation: With patient/family Time For Goal Achievement: 04/06/23 Potential to Achieve Goals: Good    Frequency Min 1X/week     Co-evaluation               AM-PAC PT 6 Clicks Mobility  Outcome Measure Help needed turning from your back to your side while in a flat bed without using bedrails?: None Help needed moving from lying on your back to sitting on the side of a flat bed without using bedrails?: None Help needed moving to and from a bed to a chair (including a wheelchair)?: A Little Help needed standing up from a chair using your arms (e.g., wheelchair or bedside chair)?: A Little Help needed to walk in hospital room?: A Little Help needed climbing 3-5 steps with a railing? : A Little 6 Click Score: 20    End of Session Equipment Utilized During  Treatment: Gait belt Activity Tolerance: Patient tolerated treatment well Patient left: in bed;with call bell/phone within reach;with bed alarm set;with family/visitor present Nurse Communication: Mobility status PT Visit Diagnosis: Difficulty in walking, not elsewhere classified (R26.2)    Time: 8848-8791 PT Time Calculation (min) (ACUTE ONLY): 17 min   Charges:   PT Evaluation $PT Eval Low Complexity: 1 Low   PT General Charges $$ ACUTE PT VISIT: 1 Visit         Sari MATSU., PT  Office # [phone]   Erven Sari Shaker 03/23/2023, 12:27 PM

## 2023-03-23 NOTE — Evaluation (Signed)
 Occupational Therapy Evaluation Patient Details Name: Todd [name] MRN: [id number] DOB: Aug 20, 1930 Today's Date: 03/23/2023   History of Present Illness Todd [name] is a 89 y.o. male who presents with episode of leg numbness.  CT head without contrast which was negative for any acute intracranial abnormalities.   MRI of the brain demonstrated subtle diffusion restriction right hippocampus but also in the right occipital lobe.  These were concerning for artifact versus acute infarct.  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  PMH of hyperlipidemia.   Clinical Impression   Pt currently at min assist level for selfcare tasks sit to stand, mobility to the toilet without assistive device, and for toileting tasks.  Some decreased understanding of verbal commands and instructions for vision testing.  Does better with visual and verbal commands in Spanish via tele interpreter.  Family present as well and able to provide PLOF information and home setup.  Unable to accurately assess vision but appears to have some left visual field deficit noted with difficulty navigating around bathroom but will continue to look at functionally in treatment.  Prior to admission pt was independent and living with his daughter.  Feel he will benefit from acute care OT at this time to help increase balance, safety, visual compensation, and overall independence with ADL tasks.  He will have 24 hour supervision from family post acute stay with recommendation for continued HHOT at discharge to further progress independence.        If plan is discharge home, recommend the following: A little help with bathing/dressing/bathroom;A little help with walking and/or transfers;Assistance with cooking/housework;Direct supervision/assist for financial management;Assist for transportation;Help with stairs or ramp for entrance;Direct  supervision/assist for medications management    Functional Status Assessment  Patient has had a recent decline in their functional status and demonstrates the ability to make significant improvements in function in a reasonable and predictable amount of time.  Equipment Recommendations  Other (comment) (tub bench which family will purchase from outside source)       Precautions / Restrictions Precautions Precautions: Fall Precaution Comments: ? left visual field cut Restrictions Weight Bearing Restrictions Per Provider Order: No      Mobility Bed Mobility Overal bed mobility: Needs Assistance Bed Mobility: Supine to Sit, Sit to Supine     Supine to sit: Supervision Sit to supine: Min assist   General bed mobility comments: Min assist to get squared back up in the bed after laying down.    Transfers Overall transfer level: Needs assistance Equipment used: None Transfers: Sit to/from Stand, Bed to chair/wheelchair/BSC Sit to Stand: Contact guard assist     Step pivot transfers: Min assist     General transfer comment: Min assist for functional mobility in the room without an assistive device.      Balance Overall balance assessment: Needs assistance Sitting-balance support: Bilateral upper extremity supported Sitting balance-Leahy Scale: Fair     Standing balance support: No upper extremity supported, During functional activity Standing balance-Leahy Scale: Poor Standing balance comment: Pt needs therapist support or UE support for balance                           ADL either performed or assessed with clinical judgement   ADL Overall ADL's : Needs assistance/impaired Eating/Feeding: Set up;Bed level   Grooming: Wash/dry hands;Contact guard assist;Standing   Upper Body Bathing:  Set up;Sitting   Lower Body Bathing: Minimal assistance;Sit to/from stand   Upper Body Dressing : Set up;Sitting   Lower Body Dressing: Minimal assistance;Sit to/from  stand   Toilet Transfer: Minimal assistance;Ambulation;Stand-pivot   Toileting- Clothing Manipulation and Hygiene: Minimal assistance;Sit to/from stand       Functional mobility during ADLs: Minimal assistance (ambulation without assistive device) General ADL Comments: Pt with difficulty understanding instructions verbally so needs a lot of visual cueing, especially for MMT.  Min assist with short step length during toilet transfer, ambulating to the bathroom without an assistive device.  Discussed recommendation for a shower bench and family agrees and will purchase outside of the hospital.     Vision Baseline Vision/History: 0 No visual deficits Ability to See in Adequate Light: 0 Adequate Patient Visual Report: No change from baseline;Other (comment) (Family reports that he's said his vision was blurry at times, but he denies it with me.) Vision Assessment?: Yes Eye Alignment: Within Functional Limits Ocular Range of Motion: Within Functional Limits Tracking/Visual Pursuits: Decreased smoothness of horizontal tracking;Decreased smoothness of vertical tracking;Impaired - to be further tested in functional context;Other (comment) (decreased sustained attention) Additional Comments: Difficult to accurately assess vision secondary to not being able to follow directions consistently.  Attempted line bisection test but he was unable to follow.  Slight difficulty noted when trying to navigate around the bathroom counter and door on the left possibly indicative of visual field deficit.     Perception Perception: Within Functional Limits (grossly intact, will continue to assess in function)       Praxis Praxis: [facility]       Pertinent Vitals/Pain Pain Assessment Pain Assessment: Faces Faces Pain Scale: No hurt     Extremity/Trunk Assessment Upper Extremity Assessment Upper Extremity Assessment: Overall WFL for tasks assessed   Lower Extremity Assessment Lower Extremity Assessment: Defer  to PT evaluation   Cervical / Trunk Assessment Cervical / Trunk Assessment: Normal   Communication Communication Communication: Difficulty following commands/understanding Following commands: Follows one step commands consistently (with gestural cueing and demonstration) Cueing Techniques: Gestural cues;Visual cues;Verbal cues   Cognition Arousal: Alert Behavior During Therapy: WFL for tasks assessed/performed Overall Cognitive Status: Difficult to assess                                 General Comments: Pt follows one step commands consistently with interpretation but also needs gestural cueing.  Unsure if it's just language barrier and age vs something acute since CVA.  Not oriented to month but did say day of the week correctly.                Home Living Family/patient expects to be discharged to:: Private residence Living Arrangements: Children (daughter) Available Help at Discharge: Available 24 hours/day;Family Type of Home: House Home Access: Stairs to enter Secretary/administrator of Steps: 3 Entrance Stairs-Rails: Left;Right Home Layout: One level     Bathroom Shower/Tub: It Trainer: Standard     Home Equipment: None          Prior Functioning/Environment Prior Level of Function : Independent/Modified Independent                        OT Problem List: Decreased strength;Impaired vision/perception;Decreased knowledge of use of DME or AE;Impaired balance (sitting and/or standing);Decreased safety awareness      OT Treatment/Interventions: Self-care/ADL training;Patient/family education;Balance training;Neuromuscular education;Therapeutic  activities;DME and/or AE instruction    OT Goals(Current goals can be found in the care plan section) Acute Rehab OT Goals Patient Stated Goal: None stated this session OT Goal Formulation: With patient/family Time For Goal Achievement: 04/06/23 Potential to Achieve  Goals: Good  OT Frequency: Min 1X/week       AM-PAC OT 6 Clicks Daily Activity     Outcome Measure Help from another person eating meals?: A Little Help from another person taking care of personal grooming?: A Little Help from another person toileting, which includes using toliet, bedpan, or urinal?: A Little Help from another person bathing (including washing, rinsing, drying)?: A Little Help from another person to put on and taking off regular upper body clothing?: A Little Help from another person to put on and taking off regular lower body clothing?: A Little 6 Click Score: 18   End of Session Equipment Utilized During Treatment: Gait belt Nurse Communication: Mobility status  Activity Tolerance: Patient tolerated treatment well Patient left: in bed;with call bell/phone within reach  OT Visit Diagnosis: Unsteadiness on feet (R26.81);Other abnormalities of gait and mobility (R26.89);Muscle weakness (generalized) (M62.81);Other symptoms and signs involving cognitive function                Time: 9169-9076 OT Time Calculation (min): 53 min Charges:  OT General Charges $OT Visit: 1 Visit OT Evaluation $OT Eval Moderate Complexity: 1 Mod OT Treatments $Self Care/Home Management : 38-52 mins  Lynwood Constant, OTR/L Acute Rehabilitation Services  Office [phone] 03/23/2023

## 2023-03-23 NOTE — Progress Notes (Signed)
                  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   Hospital Day 0  Patient feeling very well today. He feels completely at his baseline with no residual weakness or any new deficits. No difficulty eating. He understands his diagnosis and compares it to his stroke he had in 2014. Very agreeable to other medication changes.   Objective:  Vital signs in last 24 hours: Vitals:   03/23/23 0730 03/23/23 0735 03/23/23 0758 03/23/23 0829  BP:  (!) 186/92 (!) 168/86   Pulse:  89 81   Resp:  18 16   Temp: 98.6 F (37 C)  98.5 F (36.9 C)   TempSrc:   Oral   SpO2:  98% 98%   Weight:    72.4 kg  Height:    5' 2 (1.575 m)    Physical Exam:  Constitutional: elderly well-appearing, in no acute distress Cardiovascular: RRR, no murmurs, rubs or gallops Pulmonary/Chest: normal work of breathing on room air, lungs clear to auscultation bilaterally Abdominal: soft, non-tender, non-distended Neuro: 5/5 strength, no focal deficit noted  Assessment/Plan:   #Acute PCA stroke CTA head and neck shows complete occlusion of proximal right P2 segment, severe stenosis in bilateral proximal M2 branches, moderate stenosis in right M1. Right sided weakness had resolved by time of evaluation in the ED, patient reports he feels at his baseline today. Neurology following, appreciate assistance.  - Received loading dose of ASA and Plavix , continue DAPT for 3 months and then ASA alone per neurology - TTE: normal EV and right heart function, no valvular dysfunction, no shunting - 30 day cardiac monitor after discharge recommended - Patient can likely be discharged tomorrow pending PT evaluation  #HTN Takes metoprolol  25mg  BID at home. Beta blocker held for permissive hypertension with goal less than 220/110  #HLD Lipid profile showing LDL 78. Patient takes atorvastatin  10mg  at home, increasing  to 40mg  daily  #Thyroid nodules Incidental finding of multiple hypoenhancing nodules in the thyroid, recommending non-emergent ultrasound to further evaluate.   Diet: Regular VTE: Enoxaparin  Code: Full  Dispo: Anticipated discharge to Home pending medical stability.   Redell Burnet, MD PGY-1 Internal Medicine Resident Pager Number [phone] Please contact the on call pager after 5 pm and on weekends at [phone].

## 2023-03-23 NOTE — Progress Notes (Addendum)
 STROKE TEAM PROGRESS NOTE   BRIEF HPI Mr. Todd [name] is a 89 y.o. male with history of hyperlipidemia presenting with a transient episode of right leg weakness and numbness as well as apparent left hemianopsia.  The day before yesterday, he had difficulty feeling his right foot on the floor and had some difficulty with ambulation.  He then went to sleep, and when he woke up he had trouble feeling the right half of his body.  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  CT angiogram demonstrated occlusion of the right P2 segment.  Left hemianopsia is evident on exam.  NIH on Admission 2  INTERIM HISTORY/SUBJECTIVE Patient examined using Spanish language video interpreter and daughter at the bedside. Patient has remained hemodynamically stable and afebrile.  He left-sided vision difficulties only and reports no difficulties with sensation on the right side of his body at this time.  He also reports no memory problems.  OBJECTIVE  CBC    Component Value Date/Time   WBC 8.6 03/23/2023 0834   RBC 5.32 03/23/2023 0834   HGB 14.5 03/23/2023 0834   HCT 43.3 03/23/2023 0834   PLT 176 03/23/2023 0834   MCV 81.4 03/23/2023 0834   MCH 27.3 03/23/2023 0834   MCHC 33.5 03/23/2023 0834   RDW 13.2 03/23/2023 0834   LYMPHSABS 1.4 03/22/2023 1220   MONOABS 0.5 03/22/2023 1220   EOSABS 0.1 03/22/2023 1220   BASOSABS 0.1 03/22/2023 1220    BMET    Component Value Date/Time   NA 139 03/23/2023 0834   K 3.3 (L) 03/23/2023 0834   CL 104 03/23/2023 0834   CO2 24 03/23/2023 0834   GLUCOSE 111 (H) 03/23/2023 0834   BUN 11 03/23/2023 0834   CREATININE 1.14 03/23/2023 0834   CALCIUM  8.8 (L) 03/23/2023 0834   GFRNONAA >60 03/23/2023 0834    IMAGING past 24 hours CT Angio Head Neck W WO CM Result Date: 03/22/2023 CLINICAL DATA:  Possible right PCA occlusion; right hippocampal and occipital infarcts on  same-day MRI EXAM: CT ANGIOGRAPHY HEAD AND NECK WITH AND WITHOUT CONTRAST TECHNIQUE: Multidetector CT imaging of the head and neck was performed using the standard protocol during bolus administration of intravenous contrast. Multiplanar CT image reconstructions and MIPs were obtained to evaluate the vascular anatomy. Carotid stenosis measurements (when applicable) are obtained utilizing NASCET criteria, using the distal internal carotid diameter as the denominator. RADIATION DOSE REDUCTION: This exam was performed according to the departmental dose-optimization program which includes automated exposure control, adjustment of the mA and/or kV according to patient size and/or use of iterative reconstruction technique. CONTRAST:  75mL OMNIPAQUE  IOHEXOL  350 MG/ML SOLN COMPARISON:  03/22/2023 CT head, 03/22/2023 MRI head FINDINGS: CT HEAD FINDINGS For noncontrast findings, please see same day CT head. CTA NECK FINDINGS Aortic arch: The arch is incompletely included in the field of view. The imaged portion demonstrates calcified and noncalcified aortic atherosclerotic plaque. The proximal arch vessel origins are patent. Right carotid system: No evidence of dissection, occlusion, or hemodynamically significant stenosis (greater than 50%). Left carotid system: No evidence of dissection, occlusion, or hemodynamically significant stenosis (greater than 50%). Vertebral arteries: No evidence of dissection, occlusion, or hemodynamically significant stenosis (greater than 50%). Skeleton: No acute osseous abnormality. Degenerative changes in the cervical spine. Other neck: Multiple hypoenhancing nodules in the thyroid, the largest of which measures up to 1.9 cm. Upper chest: No focal pulmonary  opacity or pleural effusion. Review of the MIP images confirms the above findings CTA HEAD FINDINGS Anterior circulation: Both internal carotid arteries are patent to the termini, without significant stenosis. A1 segments patent. Normal  anterior communicating artery. Anterior cerebral arteries are patent to their distal aspects without significant stenosis. Moderate stenosis in the right M1 (series 10, image 110). Severe stenosis in a proximal right M2 (series 10, image 108). More downstream right MCA branches are irregular but without significant stenosis. The mild stenosis in the left M1 (series 10, image 103). Severe stenosis in a proximal left M2 branch (series 10, image 112). More downstream left MCA branches are irregular but without significant stenosis. Posterior circulation: Vertebral arteries patent to the vertebrobasilar junction without significant stenosis. Posterior inferior cerebellar arteries patent proximally. Basilar patent to its distal aspect without significant stenosis. Superior cerebellar arteries patent proximally. The right P1 is patent. Complete occlusion in the proximal right P2 segment (series 10, images 106 and 108), with reconstitution in the mid to distal right P2 (series 10, image 113). The area of occlusion measures approximately 10 mm. Somewhat diminished opacification in the right P3 branches the left P1 is patent. Mild stenosis in left P2 proximally (series 10, images 109-110). Venous sinuses: As permitted by contrast timing, patent. Anatomic variants: None significant. No evidence of aneurysm or vascular malformation. Review of the MIP images confirms the above findings IMPRESSION: 1. Complete occlusion in the proximal right P2 segment, with reconstitution in the mid to distal right P2. Somewhat diminished opacification in the right P3 branches. 2. Severe stenosis in a bilateral proximal M2 branches. Moderate stenosis in the right M1 and mild stenosis in the left M1. 3. No hemodynamically significant stenosis in the neck. 4. Multiple hypoenhancing nodules in the thyroid, the largest of which measures up to 1.9 cm. If this has not previously been evaluated, a non-emergent ultrasound of the thyroid could be  obtained, with consideration given to the patient's age and comorbidities. (Reference: J Am Coll Radiol. 2015 Feb;12(2): 143-50) 5. Aortic atherosclerosis. Aortic Atherosclerosis (ICD10-I70.0). These results were called by telephone at the time of interpretation on 03/22/2023 at 5:44 pm to provider JILLIAN GARDNER, who verbally acknowledged these results. Electronically Signed   By: Donald Campion M.D.   On: 03/22/2023 17:45   MR BRAIN WO CONTRAST Result Date: 03/22/2023 CLINICAL DATA:  Right-sided weakness, history of CVA. EXAM: MRI HEAD WITHOUT CONTRAST TECHNIQUE: Multiplanar, multiecho pulse sequences of the brain and surrounding structures were obtained without intravenous contrast. COMPARISON:  Head CT March 22, 2023. FINDINGS: Brain: Subtle diffuse restricted diffusion in the right hippocampus. There is also small focus of restricted diffusion in the right occipital lobe. No acute hemorrhage, hydrocephalus, extra-axial collection mass lesion. Small remote infarcts in the bilateral centrum semiovale, corona radiata the, body of the corpus callosum, bilateral thalami, right side of the pons and bilateral cerebellar hemisphere. Hemosiderin deposit seen associated with the remote infarcts in the left frontal lobe and cerebellar hemispheres. Scattered and confluent foci of T2 hyperintensity are seen within the white matter of the cerebral hemispheres, nonspecific, most likely related to chronic microangiopathy. Moderate parenchymal volume loss. Vascular: Focus of susceptibility artifact in the right crural cistern could represent intravascular thrombus. Skull and upper cervical spine: Normal marrow signal. Sinuses/Orbits: Negative. Other: None. IMPRESSION: 1. Subtle diffuse restricted diffusion in the right hippocampus and small focus of restricted diffusion in the right occipital lobe, may represent acute infarcts. 2. Focus of susceptibility artifact in the right crural cistern in  the setting of right  hippocampus and occipital lobe restricted diffusion is suggestive of thrombus in the right P2/PCA segment. Correlation with CT angiogram search set of the head and neck is suggested. 3. Multiple remote infarcts as described above. 4. Moderate chronic microangiopathy. These results were called by telephone at the time of interpretation on 03/22/2023 at 14:54 to provider [facility] , who verbally acknowledged these results. Electronically Signed   By: Katyucia  de Macedo Rodrigues M.D.   On: 03/22/2023 15:03   CT HEAD WO CONTRAST Result Date: 03/22/2023 CLINICAL DATA:  Neurologic deficit.  Stroke suspected. EXAM: CT HEAD WITHOUT CONTRAST TECHNIQUE: Contiguous axial images were obtained from the base of the skull through the vertex without intravenous contrast. RADIATION DOSE REDUCTION: This exam was performed according to the departmental dose-optimization program which includes automated exposure control, adjustment of the mA and/or kV according to patient size and/or use of iterative reconstruction technique. COMPARISON:  None Available. FINDINGS: Brain: Moderate age-related atrophy and chronic microvascular ischemic changes. There is no acute intracranial hemorrhage. No mass effect or midline shift. No extra-axial fluid collection. Vascular: No hyperdense vessel or unexpected calcification. Skull: Normal. Negative for fracture or focal lesion. Sinuses/Orbits: No acute finding. Other: None IMPRESSION: 1. No acute intracranial pathology. 2. Moderate age-related atrophy and chronic microvascular ischemic changes. Electronically Signed   By: Vanetta Chou M.D.   On: 03/22/2023 13:09    Vitals:   03/23/23 0730 03/23/23 0735 03/23/23 0758 03/23/23 0829  BP:  (!) 186/92 (!) 168/86   Pulse:  89 81   Resp:  18 16   Temp: 98.6 F (37 C)  98.5 F (36.9 C)   TempSrc:   Oral   SpO2:  98% 98%   Weight:    72.4 kg  Height:    5' 2 (1.575 m)     PHYSICAL EXAM General:  Alert, frail petite elderly  Hispanic male in no acute distress Psych:  Mood and affect appropriate for situation CV: Regular rate and rhythm on monitor Respiratory:  Regular, unlabored respirations on room air GI: Abdomen soft and nontender   NEURO:  Mental Status: AA&Ox3, patient is able to give some history of present illness with assistance of daughter Speech/Language: speech is without dysarthria or aphasia.    Cranial Nerves:  II: PERRL.  Left dense homonymous hemianopsia III, IV, VI: EOMI. Eyelids elevate symmetrically.  V: Sensation is intact to light touch and symmetrical to face.  VII: Face is symmetrical resting and smiling VIII: hearing intact to voice. IX, X: Phonation is normal.  XII: tongue is midline without fasciculations. Motor: Able to move all 4 extremities with good antigravity strength Tone: is normal and bulk is normal Sensation- Intact to light touch bilaterally.  Coordination: FTN intact bilaterally Gait- deferred  Most Recent NIH  1a Level of Conscious.: 0 1b LOC Questions: 0 1c LOC Commands: 0 2 Best Gaze: 0 3 Visual: 1 4 Facial Palsy: 0 5a Motor Arm - left: 0 5b Motor Arm - Right: 0 6a Motor Leg - Left: 0 6b Motor Leg - Right: 0 7 Limb Ataxia: 0 8 Sensory: 0 9 Best Language: 0 10 Dysarthria: 0 11 Extinct. and Inatten.: 0 TOTAL: 1   ASSESSMENT/PLAN  Acute Ischemic Infarct: Small acute infarct in right hippocampus and right occipital lobe Etiology: Multifocal intracranial vessel stenosis CT head No acute abnormality. Small vessel disease. Atrophy.  CTA head & neck complete occlusion of proximal right P2 segment with reconstitution of mid to distal right  P2, severe stenosis in bilateral proximal M2 branches, moderate stenosis in right M1, multiple hypoenhancing nodules in the thyroid MRI small areas of restricted diffusion in right hippocampus and right occipital lobe 2D Echo pending LDL 78 HgbA1c 5.8 Patient will need 30-day cardiac monitor after discharge VTE  prophylaxis -Lovenox  No antithrombotic prior to admission, now on aspirin  81 mg daily and clopidogrel  75 mg daily for 3 months and then aspirin  alone. Therapy recommendations:  Pending Disposition: pending, likely home  Hypertension Home meds: Metoprolol  25 mg twice daily Stable Blood Pressure Goal: BP less than 220/110   Hyperlipidemia Home meds: Atorvastatin  10 mg daily, increased to 40 mg LDL 78, goal < 70 Continue statin at discharge  Other Stroke Risk Factors Advanced age   Other Active Problems None  Hospital day # 0  Patient seen by NP with MD, MD to edit note as needed. Cortney E Everitt Clint Kill , MSN, AGACNP-BC [facility] See Amion for schedule and pager information 03/23/2023 12:01 PM   STROKE MD NOTE :  I have personally obtained history,examined this patient, reviewed notes, independently viewed imaging studies, participated in medical decision making and plan of care.ROS completed by me personally and pertinent positives fully documented  I have made any additions or clarifications directly to the above note. Agree with note above.  Patient presented with sudden onset of left sided vision difficulties due to embolic right PCA branch infarct with CT angiogram showing severe multifocal intracranial vascular stenosis with right P2 occlusion likely from diffuse intracranial atherosclerosis.  Recommend dual antiplatelet therapy aspirin  Plavix  for 3 months followed by aspirin  alone and aggressive risk factor modification.  Continue ongoing stroke workup.  Patient advised not to drive due to his peripheral vision loss.  Long discussion with patient and daughter at the bedside using Spanish language video interpreter and answered questions.  Greater than 50% time during this 50-minute visit was spent in counseling and coordination of care and discussion patient care team and answering questions.  Eather Popp, MD Medical Director [facility] Pager:  [phone] 03/23/2023 2:17 PM   To contact Stroke Continuity provider, please refer to [email or web address]. After hours, contact General Neurology

## 2023-03-23 NOTE — ED Notes (Addendum)
 Pt departs ED to assigned floor

## 2023-03-23 NOTE — Progress Notes (Signed)
 30 day monitor for stroke  For Dr. Tereso Newcomer to read

## 2023-03-23 NOTE — Progress Notes (Signed)
 Echocardiogram 2D Echocardiogram has been performed.  Todd [name] 03/23/2023, 10:50 AM

## 2023-03-24 ENCOUNTER — Other Ambulatory Visit (HOSPITAL_COMMUNITY): Payer: Self-pay

## 2023-03-24 ENCOUNTER — Encounter: Payer: Self-pay | Admitting: Sports Medicine

## 2023-03-24 DIAGNOSIS — I639 Cerebral infarction, unspecified: Secondary | ICD-10-CM | POA: Diagnosis not present

## 2023-03-24 DIAGNOSIS — I10 Essential (primary) hypertension: Secondary | ICD-10-CM | POA: Diagnosis not present

## 2023-03-24 DIAGNOSIS — R531 Weakness: Secondary | ICD-10-CM | POA: Diagnosis not present

## 2023-03-24 MED ORDER — CLOPIDOGREL BISULFATE 75 MG PO TABS
75.0000 mg | ORAL_TABLET | Freq: Every day | ORAL | 0 refills | Status: DC
  Filled 2023-03-24: qty 90, 90d supply, fill #0

## 2023-03-24 MED ORDER — AMLODIPINE BESYLATE 5 MG PO TABS
5.0000 mg | ORAL_TABLET | Freq: Every day | ORAL | 1 refills | Status: DC
  Filled 2023-03-24: qty 60, 60d supply, fill #0

## 2023-03-24 MED ORDER — AMLODIPINE BESYLATE 5 MG PO TABS
5.0000 mg | ORAL_TABLET | Freq: Every day | ORAL | Status: DC
  Administered 2023-03-24: 5 mg via ORAL
  Filled 2023-03-24: qty 1

## 2023-03-24 MED ORDER — ASPIRIN 81 MG PO TBEC
81.0000 mg | DELAYED_RELEASE_TABLET | Freq: Every day | ORAL | 12 refills | Status: AC
  Filled 2023-03-24: qty 60, 60d supply, fill #0

## 2023-03-24 MED ORDER — ATORVASTATIN CALCIUM 40 MG PO TABS
40.0000 mg | ORAL_TABLET | Freq: Every day | ORAL | 1 refills | Status: DC
  Filled 2023-03-24: qty 60, 60d supply, fill #0

## 2023-03-24 NOTE — Plan of Care (Signed)
  Problem: Education: Goal: Knowledge of General Education information will improve Description: Including pain rating scale, medication(s)/side effects and non-pharmacologic comfort measures Outcome: Progressing   Problem: Health Behavior/Discharge Planning: Goal: Ability to manage health-related needs will improve Outcome: Progressing   Problem: Clinical Measurements: Goal: Will remain free from infection Outcome: Progressing   Problem: Activity: Goal: Risk for activity intolerance will decrease Outcome: Progressing   Problem: Nutrition: Goal: Adequate nutrition will be maintained Outcome: Progressing   Problem: Coping: Goal: Level of anxiety will decrease Outcome: Progressing   Problem: Pain Management: Goal: General experience of comfort will improve Outcome: Progressing   Problem: Safety: Goal: Ability to remain free from injury will improve Outcome: Progressing   Problem: Education: Goal: Knowledge of disease or condition will improve Outcome: Progressing   Problem: Coping: Goal: Will verbalize positive feelings about self Outcome: Progressing   Problem: Self-Care: Goal: Ability to participate in self-care as condition permits will improve Outcome: Progressing   Problem: Nutrition: Goal: Risk of aspiration will decrease Outcome: Progressing Goal: Dietary intake will improve Outcome: Progressing

## 2023-03-24 NOTE — TOC Transition Note (Signed)
 Transition of Care [facility]) - Discharge Note   Patient Details  Name: Todd [name] MRN: [id number] Date of Birth: 04-29-1930  [facility] CM/SW Contact:  Jonathan Neighbor, RN Phone Number: 03/24/2023, 1:26 PM   Clinical Narrative:     Pt is discharging home with outpatient therapy through [facility]. Pts family will call to schedule the first appointment.  Pt has transportation home.  Final next level of care: OP Rehab Barriers to Discharge: No Barriers Identified   Patient Goals and CMS Choice   CMS Medicare.gov Compare Post Acute Care list provided to:: Patient Represenative (must comment) Choice offered to / list presented to : Adult Children      Discharge Placement                       Discharge Plan and Services Additional resources added to the After Visit Summary for     Discharge Planning Services: CM Consult                                 Social Drivers of Health (SDOH) Interventions SDOH Screenings   Food Insecurity: No Food Insecurity (03/23/2023)  Housing: Low Risk  (03/23/2023)  Transportation Needs: No Transportation Needs (03/23/2023)  Utilities: Not At Risk (03/23/2023)  Social Connections: Unknown (03/23/2023)  Tobacco Use: Low Risk  (03/22/2023)     Readmission Risk Interventions     No data to display

## 2023-03-24 NOTE — Discharge Instructions (Signed)
 Todd [name],   Usted fue hospitalizado por un derrame cerebral. Sus sntomas han mejorado y me siento cmodo dndole el alta con un seguimiento con su mdico de atencin primaria. Gracias por permitirnos ser parte de su cuidado.   Programe una cita de seguimiento con su mdico de atencin primaria dentro de 1 a 2 semanas.  Tenga en cuenta estos cambios realizados en sus medicamentos:   *Por favor COMIENCE a tomar:  Aspirina 81 mg al da Plavix  75 mg durante 3 meses (90 das) Atorvastatina 40 mg al da amlodipino 5 mg Tartrato de metoprolol  25 mg dos veces al da  Asegrese de regresar al hospital si tiene debilidad, mareos, dificultad para hablar o confusin.   Gracias,  Dr. Alana Allan

## 2023-03-24 NOTE — Progress Notes (Signed)
 Physical Therapy Treatment Patient Details Name: Todd [name] MRN: [id number] DOB: June 15, 1930 Today's Date: 03/24/2023   History of Present Illness Todd [name] is a 89 y.o. male who presents with episode of leg numbness.  CT head without contrast which was negative for any acute intracranial abnormalities.   MRI of the brain demonstrated subtle diffusion restriction right hippocampus but also in the right occipital lobe.  These were concerning for artifact versus acute infarct.  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  PMH of hyperlipidemia.    PT Comments  Pt resting in bed on arrival and agreeable to session with good progress towards acute goals. Pt requiring grossly CGA for hallway ambulation without AD support with no overt LOB. Pt able to accept mild balance challenges with head turns with minimal LOB with pt able to self correct in all instances. Pt able to ascend/descend 5 steps in therapy gym without fault. Multiple family members present throughout session and supportive. Pt continues to benefit from skilled PT services to progress toward functional mobility goals.     If plan is discharge home, recommend the following:     Can travel by private vehicle        Equipment Recommendations  None recommended by PT    Recommendations for Other Services       Precautions / Restrictions Precautions Precautions: Fall Precaution Comments: Left hemianopsia Restrictions Weight Bearing Restrictions Per Provider Order: No     Mobility  Bed Mobility Overal bed mobility: Modified Independent Bed Mobility: Supine to Sit, Sit to Supine     Supine to sit: Supervision     General bed mobility comments: supervision for safety    Transfers Overall transfer level: Needs assistance Equipment used: None Transfers: Sit to/from Stand Sit to Stand: Contact guard assist            General transfer comment: CGA for safety    Ambulation/Gait Ambulation/Gait assistance: Contact guard assist Gait Distance (Feet): 350 Feet Assistive device: None Gait Pattern/deviations: Step-through pattern, Decreased stride length Gait velocity: decreased     General Gait Details: Mildly unsteady but no LOB   Stairs Stairs: Yes Stairs assistance: Contact guard assist Stair Management: One rail Right, Step to pattern, Alternating pattern, Forwards Number of Stairs: 5 General stair comments: CGA for safety, step-to and alternating pattern without LOB   Wheelchair Mobility     Tilt Bed    Modified Rankin (Stroke Patients Only) Modified Rankin (Stroke Patients Only) Pre-Morbid Rankin Score: No symptoms Modified Rankin: Moderate disability     Balance Overall balance assessment: Needs assistance Sitting-balance support: No upper extremity supported, Feet supported Sitting balance-Leahy Scale: Good     Standing balance support: No upper extremity supported, During functional activity Standing balance-Leahy Scale: Fair Standing balance comment: Pt needs therapist support or UE support for balance                            Cognition Arousal: Alert Behavior During Therapy: WFL for tasks assessed/performed Overall Cognitive Status: Difficult to assess                                 General Comments: Pt follows one step commands consistently with interpretation, some gestural cueing needed        Exercises  General Comments General comments (skin integrity, edema, etc.): VSS on RA, ipad interpreter utilized at start of session, however pt family interpreting for ~1/2 of session      Pertinent Vitals/Pain Pain Assessment Pain Assessment: No/denies pain Faces Pain Scale: No hurt    Home Living                          Prior Function            PT Goals (current goals can now be found in the care plan  section) Acute Rehab PT Goals Patient Stated Goal: home PT Goal Formulation: With patient/family Time For Goal Achievement: 04/06/23 Progress towards PT goals: Progressing toward goals    Frequency    Min 1X/week      PT Plan      Co-evaluation              AM-PAC PT "6 Clicks" Mobility   Outcome Measure  Help needed turning from your back to your side while in a flat bed without using bedrails?: None Help needed moving from lying on your back to sitting on the side of a flat bed without using bedrails?: None Help needed moving to and from a bed to a chair (including a wheelchair)?: A Little Help needed standing up from a chair using your arms (e.g., wheelchair or bedside chair)?: A Little Help needed to walk in hospital room?: A Little Help needed climbing 3-5 steps with a railing? : A Little 6 Click Score: 20    End of Session Equipment Utilized During Treatment: Gait belt Activity Tolerance: Patient tolerated treatment well Patient left: in bed;with call bell/phone within reach;with family/visitor present (seated up EOB) Nurse Communication: Mobility status PT Visit Diagnosis: Difficulty in walking, not elsewhere classified (R26.2)     Time: 0981-1914 PT Time Calculation (min) (ACUTE ONLY): 18 min  Charges:    $Gait Training: 8-22 mins PT General Charges $$ ACUTE PT VISIT: 1 Visit                     Antony Baumgartner R. PTA Acute Rehabilitation Services Office: [phone]   Agapito Horseman 03/24/2023, 11:20 AM

## 2023-03-24 NOTE — Discharge Summary (Signed)
 Name: Todd [name] MRN: [id number] DOB: 31-Jan-1931 89 y.o. PCP: [facility]  Date of Admission: 03/22/2023 10:33 AM Date of Discharge: 03/23/22 Attending Physician: Dr. Jarvis Mesa  Discharge Diagnosis: Principal Problem:   Acute right-sided weakness Active Problems:   Cerebrovascular accident (CVA) [facility])    Discharge Medications: Allergies as of 03/24/2023   No Known Allergies      Medication List     TAKE these medications    amLODipine  5 MG tablet Commonly known as: NORVASC  Take 1 tablet (5 mg total) by mouth daily.   aspirin  EC 81 MG tablet Take 1 tablet (81 mg total) by mouth daily. Swallow whole.   atorvastatin  40 MG tablet Commonly known as: LIPITOR Take 1 tablet (40 mg total) by mouth daily. What changed:  medication strength how much to take   clopidogrel  75 MG tablet Commonly known as: PLAVIX  Take 1 tablet (75 mg total) by mouth daily. Start taking on: March 25, 2023   metoprolol  tartrate 25 MG tablet Commonly known as: LOPRESSOR  Take 25 mg by mouth 2 (two) times daily.        Disposition and follow-up:   Todd [name] was discharged from [facility] in Stable condition.  At the hospital follow up visit please address:  1.  Follow-up:  Stroke: check for recurrence of stroke symptoms. Ensure patient understands taking ASA and Plavix  for 3 months (ending 06/22/23) followed by ASA indefinitely   HTN: added on amlodipine  5mg  after discharge, recheck BP and adjust regimen as necessary   HLD: atorvastatin  increased to 40mg . Recheck lipid panel  2.  Labs / imaging needed at time of follow-up: Lipid profile, thyroid ultrasound  3.  Pending labs/ test needing follow-up: None  4.  Medication Changes  Started: Amlodipine  5mg , ASA 81, Plavix  75mg   Changed: increased atorvastatin  10mg  to 40mg   Follow-up Appointments:  Follow-up Information     [facility]. Schedule an  appointment as soon as possible for a visit.   Specialty: Rehabilitation Contact information: [address] [place]  [address] [phone]                Hospital Course by problem list:  #Acute PCA stroke Patient presented to the ED after development of acute right sided weakness. Symptoms had resolved by time of assessment in the ED. 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 Lipid profile showed well-controlled hyperlipidemia. A1c was 5.8. TTE showed normal LV, RV, and valvular function without shunting. Neurology recommended 30 day cardiac monitoring after discharge as well as DAPT for 3 months followed by aspirin  81mg  indefinitely.   #Hypertension Patient takes metoprolol  25mg  BID at home. Antihypertensive was held during admission to allow for permissive hypertension, resumed at discharge.   #Hyperlipidemia Patient takes atorvastatin  10mg  at home. Lipid profile was repeated showing LDL of 78. Atorvastatin  dose increased to 40mg .   #Thyroid nodules Incidental finding of multiple hypoenhancing nodules in the thyroid. Recommend non-emergent ultrasound to further evaluate.    Discharge Subjective: Patient feeling very well, denies any weakness or other neurologic symptoms. He feels ready to be discharged. Amenable to medication changes, all questions answered  Discharge Exam:   BP (!) 151/74 (BP Location: Right Arm)   Pulse 70   Temp (!) 97.5 F (36.4 C) (Oral)   Resp 17   Ht 5\' 2"  (1.575 m)   Wt 72.4 kg   SpO2 92%  BMI 29.19 kg/m   Constitutional: well-appearing, sitting in bed, in no acute distress Cardiovascular: regular rate and rhythm, no m/r/g Pulmonary/Chest: normal work of breathing on room air, lungs clear to auscultation bilaterally MSK: normal bulk and tone Neurological: alert & oriented x 3, 5/5 strength in bilateral upper and lower extremities, normal  gait   Pertinent Labs, Studies, and Procedures:     Latest Ref Rng & Units 03/23/2023    8:34 AM 03/22/2023   12:20 PM 03/22/2023   11:20 AM  CBC  WBC 4.0 - 10.5 K/uL 8.6  7.3    Hemoglobin 13.0 - 17.0 g/dL 16.1  09.6  04.5   Hematocrit 39.0 - 52.0 % 43.3  45.5  46.0   Platelets 150 - 400 K/uL 176  185         Latest Ref Rng & Units 03/23/2023    8:34 AM 03/22/2023   12:20 PM 03/22/2023   11:20 AM  CMP  Glucose 70 - 99 mg/dL 409  811  914   BUN 8 - 23 mg/dL 11  13  15    Creatinine 0.61 - 1.24 mg/dL 7.82  9.56  2.13   Sodium 135 - 145 mmol/L 139  140  142   Potassium 3.5 - 5.1 mmol/L 3.3  4.0  3.9   Chloride 98 - 111 mmol/L 104  104  104   CO2 22 - 32 mmol/L 24  27    Calcium  8.9 - 10.3 mg/dL 8.8  8.9    Total Protein 6.5 - 8.1 g/dL  8.0    Total Bilirubin 0.0 - 1.2 mg/dL  0.8    Alkaline Phos 38 - 126 U/L  103    AST 15 - 41 U/L  24    ALT 0 - 44 U/L  18      ECHOCARDIOGRAM COMPLETE BUBBLE STUDY Result Date: 03/23/2023    ECHOCARDIOGRAM REPORT   Patient Name:   Todd [name] Date of Exam: 03/23/2023 Medical Rec #:  [id number]         Height:       62.0 in Accession #:    [id number]        Weight:       159.6 lb Date of Birth:  11-05-1930         BSA:          1.737 m Patient Age:    89 years          BP:           168/86 mmHg Patient Gender: M                 HR:           79 bpm. Exam Location:  Inpatient Procedure: 2D Echo, Cardiac Doppler and Color Doppler Indications:    TIA 435.9/ G45.9  History:        Patient has no prior history of Echocardiogram examinations.  Sonographer:    Terrilee Few RCS Referring Phys: [id number] JULIE MACHEN IMPRESSIONS  1. Left ventricular ejection fraction, by estimation, is 65 to 70%. The left ventricle has normal function. The left ventricle has no regional wall motion abnormalities. There is mild left ventricular hypertrophy. Left ventricular diastolic parameters are consistent with Grade I diastolic dysfunction (impaired relaxation).  2. Right  ventricular systolic function is normal. The right ventricular size is normal.  3. A small pericardial effusion is present. The pericardial effusion is anterior to the right ventricle.  4. The  mitral valve is normal in structure. No evidence of mitral valve regurgitation. No evidence of mitral stenosis.  5. The aortic valve was not well visualized. Aortic valve regurgitation is not visualized. Aortic valve sclerosis is present, with no evidence of aortic valve stenosis. Comparison(s): No prior Echocardiogram. FINDINGS  Left Ventricle: Left ventricular ejection fraction, by estimation, is 65 to 70%. The left ventricle has normal function. The left ventricle has no regional wall motion abnormalities. The left ventricular internal cavity size was normal in size. There is  mild left ventricular hypertrophy. Left ventricular diastolic parameters are consistent with Grade I diastolic dysfunction (impaired relaxation). Right Ventricle: The right ventricular size is normal. No increase in right ventricular wall thickness. Right ventricular systolic function is normal. Left Atrium: Left atrial size was not well visualized. Right Atrium: Right atrial size was not well visualized. Pericardium: A small pericardial effusion is present. The pericardial effusion is anterior to the right ventricle. Mitral Valve: The mitral valve is normal in structure. No evidence of mitral valve regurgitation. No evidence of mitral valve stenosis. Tricuspid Valve: The tricuspid valve is normal in structure. Tricuspid valve regurgitation is not demonstrated. No evidence of tricuspid stenosis. Aortic Valve: The aortic valve was not well visualized. Aortic valve regurgitation is not visualized. Aortic valve sclerosis is present, with no evidence of aortic valve stenosis. Aortic valve peak gradient measures 5.3 mmHg. Pulmonic Valve: The pulmonic valve was normal in structure. Pulmonic valve regurgitation is not visualized. No evidence of pulmonic  stenosis. Aorta: The aortic root and ascending aorta are structurally normal, with no evidence of dilitation. IAS/Shunts: The atrial septum is grossly normal.  LEFT VENTRICLE PLAX 2D LVOT diam:     2.00 cm   Diastology LV SV:         51        LV e' medial:    6.96 cm/s LV SV Index:   29        LV E/e' medial:  8.7 LVOT Area:     3.14 cm  LV e' lateral:   7.51 cm/s                          LV E/e' lateral: 8.0  RIGHT VENTRICLE RV S prime:     17.30 cm/s TAPSE (M-mode): 1.8 cm LEFT ATRIUM             Index LA Vol (A2C):   25.5 ml 14.68 ml/m LA Vol (A4C):   44.8 ml 25.79 ml/m LA Biplane Vol: 35.3 ml 20.32 ml/m  AORTIC VALVE AV Area (Vmax): 2.46 cm AV Vmax:        115.00 cm/s AV Peak Grad:   5.3 mmHg LVOT Vmax:      90.15 cm/s LVOT Vmean:     60.250 cm/s LVOT VTI:       0.162 m  AORTA Ao Root diam: 3.50 cm Ao Asc diam:  3.10 cm MITRAL VALVE MV Area (PHT): 3.37 cm    SHUNTS MV Decel Time: 225 msec    Systemic VTI:  0.16 m MV E velocity: 60.30 cm/s  Systemic Diam: 2.00 cm MV A velocity: 92.00 cm/s MV E/A ratio:  0.66 Gloriann Larger MD Electronically signed by Gloriann Larger MD Signature Date/Time: 03/23/2023/12:14:19 PM    Final    CT Angio Head Neck W WO CM Result Date: 03/22/2023 CLINICAL DATA:  Possible right PCA occlusion; right hippocampal and occipital infarcts on same-day MRI EXAM: CT ANGIOGRAPHY HEAD  AND NECK WITH AND WITHOUT CONTRAST TECHNIQUE: Multidetector CT imaging of the head and neck was performed using the standard protocol during bolus administration of intravenous contrast. Multiplanar CT image reconstructions and MIPs were obtained to evaluate the vascular anatomy. Carotid stenosis measurements (when applicable) are obtained utilizing NASCET criteria, using the distal internal carotid diameter as the denominator. RADIATION DOSE REDUCTION: This exam was performed according to the departmental dose-optimization program which includes automated exposure control, adjustment of the mA  and/or kV according to patient size and/or use of iterative reconstruction technique. CONTRAST:  75mL OMNIPAQUE  IOHEXOL  350 MG/ML SOLN COMPARISON:  03/22/2023 CT head, 03/22/2023 MRI head FINDINGS: CT HEAD FINDINGS For noncontrast findings, please see same day CT head. CTA NECK FINDINGS Aortic arch: The arch is incompletely included in the field of view. The imaged portion demonstrates calcified and noncalcified aortic atherosclerotic plaque. The proximal arch vessel origins are patent. Right carotid system: No evidence of dissection, occlusion, or hemodynamically significant stenosis (greater than 50%). Left carotid system: No evidence of dissection, occlusion, or hemodynamically significant stenosis (greater than 50%). Vertebral arteries: No evidence of dissection, occlusion, or hemodynamically significant stenosis (greater than 50%). Skeleton: No acute osseous abnormality. Degenerative changes in the cervical spine. Other neck: Multiple hypoenhancing nodules in the thyroid, the largest of which measures up to 1.9 cm. Upper chest: No focal pulmonary opacity or pleural effusion. Review of the MIP images confirms the above findings CTA HEAD FINDINGS Anterior circulation: Both internal carotid arteries are patent to the termini, without significant stenosis. A1 segments patent. Normal anterior communicating artery. Anterior cerebral arteries are patent to their distal aspects without significant stenosis. Moderate stenosis in the right M1 (series 10, image 110). Severe stenosis in a proximal right M2 (series 10, image 108). More downstream right MCA branches are irregular but without significant stenosis. The mild stenosis in the left M1 (series 10, image 103). Severe stenosis in a proximal left M2 branch (series 10, image 112). More downstream left MCA branches are irregular but without significant stenosis. Posterior circulation: Vertebral arteries patent to the vertebrobasilar junction without significant  stenosis. Posterior inferior cerebellar arteries patent proximally. Basilar patent to its distal aspect without significant stenosis. Superior cerebellar arteries patent proximally. The right P1 is patent. Complete occlusion in the proximal right P2 segment (series 10, images 106 and 108), with reconstitution in the mid to distal right P2 (series 10, image 113). The area of occlusion measures approximately 10 mm. Somewhat diminished opacification in the right P3 branches the left P1 is patent. Mild stenosis in left P2 proximally (series 10, images 109-110). Venous sinuses: As permitted by contrast timing, patent. Anatomic variants: None significant. No evidence of aneurysm or vascular malformation. Review of the MIP images confirms the above findings IMPRESSION: 1. Complete occlusion in the proximal right P2 segment, with reconstitution in the mid to distal right P2. Somewhat diminished opacification in the right P3 branches. 2. Severe stenosis in a bilateral proximal M2 branches. Moderate stenosis in the right M1 and mild stenosis in the left M1. 3. No hemodynamically significant stenosis in the neck. 4. Multiple hypoenhancing nodules in the thyroid, the largest of which measures up to 1.9 cm. If this has not previously been evaluated, a non-emergent ultrasound of the thyroid could be obtained, with consideration given to the patient's age and comorbidities. (Reference: J Am Coll Radiol. 2015 Feb;12(2): 143-50) 5. Aortic atherosclerosis. Aortic Atherosclerosis (ICD10-I70.0). These results were called by telephone at the time of interpretation on 03/22/2023 at 5:44 pm to  provider JILLIAN GARDNER, who verbally acknowledged these results. Electronically Signed   By: Zoila Hines M.D.   On: 03/22/2023 17:45   MR BRAIN WO CONTRAST Result Date: 03/22/2023 CLINICAL DATA:  Right-sided weakness, history of CVA. EXAM: MRI HEAD WITHOUT CONTRAST TECHNIQUE: Multiplanar, multiecho pulse sequences of the brain and surrounding  structures were obtained without intravenous contrast. COMPARISON:  Head CT March 22, 2023. FINDINGS: Brain: Subtle diffuse restricted diffusion in the right hippocampus. There is also small focus of restricted diffusion in the right occipital lobe. No acute hemorrhage, hydrocephalus, extra-axial collection mass lesion. Small remote infarcts in the bilateral centrum semiovale, corona radiata the, body of the corpus callosum, bilateral thalami, right side of the pons and bilateral cerebellar hemisphere. Hemosiderin deposit seen associated with the remote infarcts in the left frontal lobe and cerebellar hemispheres. Scattered and confluent foci of T2 hyperintensity are seen within the white matter of the cerebral hemispheres, nonspecific, most likely related to chronic microangiopathy. Moderate parenchymal volume loss. Vascular: Focus of susceptibility artifact in the right crural cistern could represent intravascular thrombus. Skull and upper cervical spine: Normal marrow signal. Sinuses/Orbits: Negative. Other: None. IMPRESSION: 1. Subtle diffuse restricted diffusion in the right hippocampus and small focus of restricted diffusion in the right occipital lobe, may represent acute infarcts. 2. Focus of susceptibility artifact in the right crural cistern in the setting of right hippocampus and occipital lobe restricted diffusion is suggestive of thrombus in the right P2/PCA segment. Correlation with CT angiogram search set of the head and neck is suggested. 3. Multiple remote infarcts as described above. 4. Moderate chronic microangiopathy. These results were called by telephone at the time of interpretation on 03/22/2023 at 14:54 to provider [facility] , who verbally acknowledged these results. Electronically Signed   By: Katyucia  de Macedo Rodrigues M.D.   On: 03/22/2023 15:03   CT HEAD WO CONTRAST Result Date: 03/22/2023 CLINICAL DATA:  Neurologic deficit.  Stroke suspected. EXAM: CT HEAD WITHOUT CONTRAST  TECHNIQUE: Contiguous axial images were obtained from the base of the skull through the vertex without intravenous contrast. RADIATION DOSE REDUCTION: This exam was performed according to the departmental dose-optimization program which includes automated exposure control, adjustment of the mA and/or kV according to patient size and/or use of iterative reconstruction technique. COMPARISON:  None Available. FINDINGS: Brain: Moderate age-related atrophy and chronic microvascular ischemic changes. There is no acute intracranial hemorrhage. No mass effect or midline shift. No extra-axial fluid collection. Vascular: No hyperdense vessel or unexpected calcification. Skull: Normal. Negative for fracture or focal lesion. Sinuses/Orbits: No acute finding. Other: None IMPRESSION: 1. No acute intracranial pathology. 2. Moderate age-related atrophy and chronic microvascular ischemic changes. Electronically Signed   By: Angus Bark M.D.   On: 03/22/2023 13:09     Discharge Instructions: Discharge Instructions     Ambulatory referral to Occupational Therapy   Complete by: As directed        Signed: Jayson Michael, MD PGY-1 03/24/2023, 1:16 PM   Pager: [phone]

## 2023-03-24 NOTE — Progress Notes (Signed)
 Discharge instructions given by Arnulfo Bidding RN. PAtient taken to car with family via WC.

## 2023-03-24 NOTE — Progress Notes (Signed)
 STROKE TEAM PROGRESS NOTE   BRIEF HPI Mr. Todd [name] is a 89 y.o. male with history of hyperlipidemia presenting with a transient episode of right leg weakness and numbness as well as apparent left hemianopsia.  The day before yesterday, he had difficulty feeling his right foot on the floor and had some difficulty with ambulation.  He then went to sleep, and when he woke up he had trouble feeling the right half of his body.  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  CT angiogram demonstrated occlusion of the right P2 segment.  Left hemianopsia is evident on exam.  NIH on Admission 2  INTERIM HISTORY/SUBJECTIVE Patient is sitting comfortably in bed and daughter at the bedside. Patient has remained hemodynamically stable and afebrile.   Continues to have left-sided peripheral vision difficulties.  Neurological exam is unchanged.  Vital signs stable.  OBJECTIVE  CBC    Component Value Date/Time   WBC 8.6 03/23/2023 0834   RBC 5.32 03/23/2023 0834   HGB 14.5 03/23/2023 0834   HCT 43.3 03/23/2023 0834   PLT 176 03/23/2023 0834   MCV 81.4 03/23/2023 0834   MCH 27.3 03/23/2023 0834   MCHC 33.5 03/23/2023 0834   RDW 13.2 03/23/2023 0834   LYMPHSABS 1.4 03/22/2023 1220   MONOABS 0.5 03/22/2023 1220   EOSABS 0.1 03/22/2023 1220   BASOSABS 0.1 03/22/2023 1220    BMET    Component Value Date/Time   NA 139 03/23/2023 0834   K 3.3 (L) 03/23/2023 0834   CL 104 03/23/2023 0834   CO2 24 03/23/2023 0834   GLUCOSE 111 (H) 03/23/2023 0834   BUN 11 03/23/2023 0834   CREATININE 1.14 03/23/2023 0834   CALCIUM  8.8 (L) 03/23/2023 0834   GFRNONAA >60 03/23/2023 0834    IMAGING past 24 hours No results found.   Vitals:   03/24/23 0035 03/24/23 0411 03/24/23 0717 03/24/23 1112  BP: (!) 181/91 (!) 151/69 (!) 170/79 (!) 151/74  Pulse: 82 77 77 70  Resp: 18 20 17 17   Temp: 98.2 F (36.8 C)  98.5 F (36.9 C) 98 F (36.7 C) (!) 97.5 F (36.4 C)  TempSrc: Oral Oral Oral Oral  SpO2: 99% 94% 97% 92%  Weight:      Height:         PHYSICAL EXAM General:  Alert, frail petite elderly Hispanic male in no acute distress Psych:  Mood and affect appropriate for situation CV: Regular rate and rhythm on monitor Respiratory:  Regular, unlabored respirations on room air GI: Abdomen soft and nontender   NEURO:  Mental Status: AA&Ox3, patient is able to give some history of present illness with assistance of daughter Speech/Language: speech is without dysarthria or aphasia.    Cranial Nerves:  II: PERRL.  Left dense homonymous hemianopsia III, IV, VI: EOMI. Eyelids elevate symmetrically.  V: Sensation is intact to light touch and symmetrical to face.  VII: Face is symmetrical resting and smiling VIII: hearing intact to voice. IX, X: Phonation is normal.  XII: tongue is midline without fasciculations. Motor: Able to move all 4 extremities with good antigravity strength Tone: is normal and bulk is normal Sensation- Intact to light touch bilaterally.  Coordination: FTN intact bilaterally Gait- deferred  Most Recent NIH  1a Level of Conscious.: 0 1b LOC Questions: 0 1c LOC Commands: 0 2 Best Gaze: 0 3 Visual: 1 4 Facial Palsy: 0  5a Motor Arm - left: 0 5b Motor Arm - Right: 0 6a Motor Leg - Left: 0 6b Motor Leg - Right: 0 7 Limb Ataxia: 0 8 Sensory: 0 9 Best Language: 0 10 Dysarthria: 0 11 Extinct. and Inatten.: 0 TOTAL: 1   ASSESSMENT/PLAN  Acute Ischemic Infarct: Small acute infarct in right hippocampus and right occipital lobe Etiology: Multifocal intracranial vessel stenosis CT head No acute abnormality. Small vessel disease. Atrophy.  CTA head & neck complete occlusion of proximal right P2 segment with reconstitution of mid to distal right P2, severe stenosis in bilateral proximal M2 branches, moderate stenosis in right M1, multiple hypoenhancing nodules in the  thyroid MRI small areas of restricted diffusion in right hippocampus and right occipital lobe 2D Echo ejection fraction 65 to 70%. HgbA1c 5.8 Patient will need 30-day cardiac monitor after discharge VTE prophylaxis -Lovenox  No antithrombotic prior to admission, now on aspirin  81 mg daily and clopidogrel  75 mg daily for 3 months and then aspirin  alone. Therapy recommendations:  Pending Disposition: pending, likely home  Hypertension Home meds: Metoprolol  25 mg twice daily Stable Blood Pressure Goal: BP less than 220/110   Hyperlipidemia Home meds: Atorvastatin  10 mg daily, increased to 40 mg LDL 78, goal < 70 Continue statin at discharge  Other Stroke Risk Factors Advanced age   Other Active Problems None  Hospital day # 0   Patient presented with sudden onset of left sided vision difficulties due to embolic right PCA branch infarct with CT angiogram showing severe multifocal intracranial vascular stenosis with right P2 occlusion likely from diffuse intracranial atherosclerosis.  Recommend dual antiplatelet therapy aspirin  Plavix  for 3 months followed by aspirin  alone and aggressive risk factor modification.     Patient advised not to drive due to his peripheral vision loss.  Long discussion with patient and daughter at the bedside   and answered questions.  Stroke team will sign off.  Kindly call for questions follow-up as an outpatient stroke clinic in 2 months   Ardella Beaver, MD Medical Director [facility] Pager: [phone] 03/24/2023 1:27 PM   To contact Stroke Continuity provider, please refer to [email or web address]. After hours, contact General Neurology

## 2023-03-31 ENCOUNTER — Encounter (HOSPITAL_COMMUNITY): Payer: Self-pay

## 2023-03-31 ENCOUNTER — Emergency Department (HOSPITAL_COMMUNITY): Payer: Medicaid Other

## 2023-03-31 ENCOUNTER — Observation Stay (HOSPITAL_COMMUNITY)
Admission: EM | Admit: 2023-03-31 | Discharge: 2023-04-01 | Disposition: A | Discharge status: Home / Self Care | Payer: Medicaid Other | Attending: Internal Medicine | Admitting: Internal Medicine

## 2023-03-31 ENCOUNTER — Other Ambulatory Visit: Payer: Self-pay

## 2023-03-31 DIAGNOSIS — J111 Influenza due to unidentified influenza virus with other respiratory manifestations: Secondary | ICD-10-CM | POA: Diagnosis present

## 2023-03-31 DIAGNOSIS — Z1152 Encounter for screening for COVID-19: Secondary | ICD-10-CM | POA: Insufficient documentation

## 2023-03-31 DIAGNOSIS — I639 Cerebral infarction, unspecified: Secondary | ICD-10-CM | POA: Diagnosis not present

## 2023-03-31 DIAGNOSIS — I6381 Other cerebral infarction due to occlusion or stenosis of small artery: Secondary | ICD-10-CM | POA: Diagnosis not present

## 2023-03-31 DIAGNOSIS — Z7982 Long term (current) use of aspirin: Secondary | ICD-10-CM | POA: Insufficient documentation

## 2023-03-31 DIAGNOSIS — Z7902 Long term (current) use of antithrombotics/antiplatelets: Secondary | ICD-10-CM | POA: Diagnosis not present

## 2023-03-31 DIAGNOSIS — J101 Influenza due to other identified influenza virus with other respiratory manifestations: Principal | ICD-10-CM

## 2023-03-31 DIAGNOSIS — R200 Anesthesia of skin: Secondary | ICD-10-CM | POA: Diagnosis present

## 2023-03-31 DIAGNOSIS — Z87891 Personal history of nicotine dependence: Secondary | ICD-10-CM | POA: Diagnosis not present

## 2023-03-31 DIAGNOSIS — Z79899 Other long term (current) drug therapy: Secondary | ICD-10-CM | POA: Diagnosis not present

## 2023-03-31 DIAGNOSIS — R944 Abnormal results of kidney function studies: Secondary | ICD-10-CM | POA: Diagnosis not present

## 2023-03-31 DIAGNOSIS — I10 Essential (primary) hypertension: Secondary | ICD-10-CM | POA: Insufficient documentation

## 2023-03-31 DIAGNOSIS — E785 Hyperlipidemia, unspecified: Secondary | ICD-10-CM | POA: Diagnosis not present

## 2023-03-31 DIAGNOSIS — E041 Nontoxic single thyroid nodule: Secondary | ICD-10-CM | POA: Insufficient documentation

## 2023-03-31 DIAGNOSIS — J09X9 Influenza due to identified novel influenza A virus with other manifestations: Secondary | ICD-10-CM | POA: Insufficient documentation

## 2023-03-31 DIAGNOSIS — E876 Hypokalemia: Secondary | ICD-10-CM | POA: Diagnosis not present

## 2023-03-31 LAB — URINALYSIS, ROUTINE W REFLEX MICROSCOPIC
Bilirubin Urine: NEGATIVE
Glucose, UA: NEGATIVE mg/dL
Ketones, ur: 5 mg/dL — AB
Leukocytes,Ua: NEGATIVE
Nitrite: NEGATIVE
Protein, ur: 30 mg/dL — AB
Specific Gravity, Urine: 1.018 (ref 1.005–1.030)
pH: 5 (ref 5.0–8.0)

## 2023-03-31 LAB — CBC WITH DIFFERENTIAL/PLATELET
Abs Immature Granulocytes: 0.03 10*3/uL (ref 0.00–0.07)
Basophils Absolute: 0 10*3/uL (ref 0.0–0.1)
Basophils Relative: 0 %
Eosinophils Absolute: 0 10*3/uL (ref 0.0–0.5)
Eosinophils Relative: 0 %
HCT: 44.1 % (ref 39.0–52.0)
Hemoglobin: 14.5 g/dL (ref 13.0–17.0)
Immature Granulocytes: 1 %
Lymphocytes Relative: 13 %
Lymphs Abs: 0.8 10*3/uL (ref 0.7–4.0)
MCH: 26.9 pg (ref 26.0–34.0)
MCHC: 32.9 g/dL (ref 30.0–36.0)
MCV: 81.7 fL (ref 80.0–100.0)
Monocytes Absolute: 0.7 10*3/uL (ref 0.1–1.0)
Monocytes Relative: 11 %
Neutro Abs: 4.6 10*3/uL (ref 1.7–7.7)
Neutrophils Relative %: 75 %
Platelets: 126 10*3/uL — ABNORMAL LOW (ref 150–400)
RBC: 5.4 MIL/uL (ref 4.22–5.81)
RDW: 13.3 % (ref 11.5–15.5)
WBC: 6.1 10*3/uL (ref 4.0–10.5)
nRBC: 0 % (ref 0.0–0.2)

## 2023-03-31 LAB — I-STAT CHEM 8, ED
BUN: 13 mg/dL (ref 8–23)
Calcium, Ion: 1.1 mmol/L — ABNORMAL LOW (ref 1.15–1.40)
Chloride: 100 mmol/L (ref 98–111)
Creatinine, Ser: 1.3 mg/dL — ABNORMAL HIGH (ref 0.61–1.24)
Glucose, Bld: 101 mg/dL — ABNORMAL HIGH (ref 70–99)
HCT: 44 % (ref 39.0–52.0)
Hemoglobin: 15 g/dL (ref 13.0–17.0)
Potassium: 3.5 mmol/L (ref 3.5–5.1)
Sodium: 138 mmol/L (ref 135–145)
TCO2: 25 mmol/L (ref 22–32)

## 2023-03-31 LAB — COMPREHENSIVE METABOLIC PANEL
ALT: 30 U/L (ref 0–44)
AST: 33 U/L (ref 15–41)
Albumin: 3.7 g/dL (ref 3.5–5.0)
Alkaline Phosphatase: 91 U/L (ref 38–126)
Anion gap: 11 (ref 5–15)
BUN: 13 mg/dL (ref 8–23)
CO2: 22 mmol/L (ref 22–32)
Calcium: 8.3 mg/dL — ABNORMAL LOW (ref 8.9–10.3)
Chloride: 102 mmol/L (ref 98–111)
Creatinine, Ser: 1.3 mg/dL — ABNORMAL HIGH (ref 0.61–1.24)
GFR, Estimated: 52 mL/min — ABNORMAL LOW (ref >60)
Glucose, Bld: 104 mg/dL — ABNORMAL HIGH (ref 70–99)
Potassium: 3.3 mmol/L — ABNORMAL LOW (ref 3.5–5.1)
Sodium: 135 mmol/L (ref 135–145)
Total Bilirubin: 0.6 mg/dL (ref 0.0–1.2)
Total Protein: 7.7 g/dL (ref 6.5–8.1)

## 2023-03-31 LAB — I-STAT CG4 LACTIC ACID, ED
Lactic Acid, Venous: 1.5 mmol/L (ref 0.5–1.9)
Lactic Acid, Venous: 1.1 mmol/L (ref 0.5–1.9)

## 2023-03-31 LAB — TROPONIN I (HIGH SENSITIVITY)
Troponin I (High Sensitivity): 24 ng/L — ABNORMAL HIGH (ref <18)
Troponin I (High Sensitivity): 24 ng/L — ABNORMAL HIGH (ref <18)

## 2023-03-31 LAB — RESP PANEL BY RT-PCR (RSV, FLU A&B, COVID)  RVPGX2
Influenza A by PCR: POSITIVE — AB
Influenza B by PCR: NEGATIVE
Resp Syncytial Virus by PCR: NEGATIVE
SARS Coronavirus 2 by RT PCR: NEGATIVE

## 2023-03-31 LAB — LIPASE, BLOOD: Lipase: 37 U/L (ref 11–51)

## 2023-03-31 MED ORDER — CLOPIDOGREL BISULFATE 75 MG PO TABS
75.0000 mg | ORAL_TABLET | Freq: Every day | ORAL | Status: DC
  Administered 2023-04-01: 75 mg via ORAL
  Filled 2023-03-31: qty 1

## 2023-03-31 MED ORDER — ACETAMINOPHEN 325 MG PO TABS: 650.0000 mg | ORAL_TABLET | Freq: Four times a day (QID) | ORAL | Status: DC | PRN

## 2023-03-31 MED ORDER — LACTATED RINGERS IV BOLUS
500.0000 mL | Freq: Once | INTRAVENOUS | Status: AC
  Administered 2023-03-31: 500 mL via INTRAVENOUS

## 2023-03-31 MED ORDER — OSELTAMIVIR PHOSPHATE 75 MG PO CAPS: 75.0000 mg | ORAL_CAPSULE | Freq: Once | ORAL | Status: DC

## 2023-03-31 MED ORDER — OSELTAMIVIR PHOSPHATE 30 MG PO CAPS
30.0000 mg | ORAL_CAPSULE | Freq: Two times a day (BID) | ORAL | Status: DC
  Filled 2023-03-31: qty 1

## 2023-03-31 MED ORDER — OSELTAMIVIR PHOSPHATE 30 MG PO CAPS: 30.0000 mg | ORAL_CAPSULE | Freq: Every day | ORAL | Status: DC

## 2023-03-31 MED ORDER — ATORVASTATIN CALCIUM 40 MG PO TABS
40.0000 mg | ORAL_TABLET | Freq: Every day | ORAL | Status: DC
  Administered 2023-04-01: 40 mg via ORAL
  Filled 2023-03-31: qty 1

## 2023-03-31 MED ORDER — LORAZEPAM 2 MG/ML IJ SOLN
0.5000 mg | Freq: Once | INTRAMUSCULAR | Status: AC
  Administered 2023-03-31: 0.5 mg via INTRAVENOUS
  Filled 2023-03-31: qty 1

## 2023-03-31 MED ORDER — SENNOSIDES-DOCUSATE SODIUM 8.6-50 MG PO TABS: 1.0000 | ORAL_TABLET | Freq: Every evening | ORAL | Status: DC | PRN

## 2023-03-31 MED ORDER — ACETAMINOPHEN 325 MG PO TABS
650.0000 mg | ORAL_TABLET | Freq: Once | ORAL | Status: AC
  Administered 2023-03-31: 650 mg via ORAL
  Filled 2023-03-31: qty 2

## 2023-03-31 MED ORDER — ACETAMINOPHEN 650 MG RE SUPP: 650.0000 mg | Freq: Four times a day (QID) | RECTAL | Status: DC | PRN

## 2023-03-31 MED ORDER — LORAZEPAM 1 MG PO TABS: 0.5000 mg | ORAL_TABLET | Freq: Once | ORAL | Status: DC

## 2023-03-31 MED ORDER — IOHEXOL 350 MG/ML SOLN
100.0000 mL | Freq: Once | INTRAVENOUS | Status: AC | PRN
  Administered 2023-03-31: 100 mL via INTRAVENOUS

## 2023-03-31 MED ORDER — BENZONATATE 100 MG PO CAPS: 100.0000 mg | ORAL_CAPSULE | Freq: Three times a day (TID) | ORAL | Status: DC | PRN

## 2023-03-31 MED ORDER — ASPIRIN 81 MG PO TBEC
81.0000 mg | DELAYED_RELEASE_TABLET | Freq: Every day | ORAL | Status: DC
  Administered 2023-04-01: 81 mg via ORAL
  Filled 2023-03-31: qty 1

## 2023-03-31 MED ORDER — ENOXAPARIN SODIUM 40 MG/0.4ML IJ SOSY
40.0000 mg | PREFILLED_SYRINGE | INTRAMUSCULAR | Status: DC
  Administered 2023-04-01: 40 mg via SUBCUTANEOUS
  Filled 2023-03-31: qty 0.4

## 2023-03-31 NOTE — ED Provider Notes (Signed)
[place] EMERGENCY DEPARTMENT AT [name] Provider Note   CSN: [id number] Arrival date & time: 03/31/23  1011     History  Chief Complaint  Patient presents with   Numbness    Todd [name] is a 89 y.o. male history of PCI infarct, hypertension presented for 5 days of leg/arm/facial numbness with chest pain and shortness of breath and some abdominal discomfort.  Patient was recently seen and admitted for PCA infarction discharged on dual antiplatelet therapy which he has been taking these medications as prescribed.  Patient states he first noticed that paresthesias and weakness in his bilateral legs but since then states that he has had paresthesias and weakness in bilateral arms as well.  Patient does not describe this as ascending.  Patient also does endorse some shortness of breath and chest pain that is pleuritic.  Patient also endorses generalized abdominal tenderness but denies any nausea vomiting diarrhea or urinary symptoms.  Patient denies fevers or productive cough.  Patient states that his face does feel numb all over but denies any slurring of words, facial droop, vision changes, neck pain.  Spanish interpreter [phone]  Home Medications Prior to Admission medications   Medication Sig Start Date End Date Taking? Authorizing Provider  ondansetron (ZOFRAN-ODT) 4 MG disintegrating tablet Take 4 mg by mouth every 8 (eight) hours as needed. 03/30/23  Yes [provider]  amLODipine (NORVASC) 5 MG tablet Take 1 tablet (5 mg total) by mouth daily. 10/01/17   Wallis Bamberg, PA-C  amLODipine (NORVASC) 5 MG tablet Take 1 tablet (5 mg total) by mouth daily. 03/24/23   Monna Fam, MD  amoxicillin-clavulanate (AUGMENTIN) 875-125 MG tablet Take 1 tablet by mouth every 12 (twelve) hours. 10/16/22   Radford Pax, NP  aspirin EC 81 MG tablet Take 81 mg by mouth daily. Swallow whole.    [provider]  aspirin EC 81 MG tablet Take 1 tablet (81 mg total) by mouth  daily. Swallow whole. 03/24/23   Monna Fam, MD  atorvastatin (LIPITOR) 10 MG tablet Take 1 tablet (10 mg total) by mouth daily. 10/01/17   Wallis Bamberg, PA-C  atorvastatin (LIPITOR) 40 MG tablet Take 1 tablet (40 mg total) by mouth daily. 03/24/23   Monna Fam, MD  benzonatate (TESSALON) 100 MG capsule Take 1 capsule (100 mg total) by mouth every 8 (eight) hours. 10/16/22   Radford Pax, NP  clopidogrel (PLAVIX) 75 MG tablet Take 1 tablet (75 mg total) by mouth daily. 03/25/23   Monna Fam, MD  dextromethorphan-guaiFENesin [facility] DM) 30-600 MG 12hr tablet Take 1 tablet by mouth 2 (two) times daily. Patient not taking: Reported on 10/20/2022 04/08/22   Ellsworth Lennox, PA-C  metoprolol tartrate (LOPRESSOR) 25 MG tablet Take 1 tablet (25 mg total) by mouth 2 (two) times daily. 10/20/22 01/18/23  Azalee Course, PA  metoprolol tartrate (LOPRESSOR) 25 MG tablet Take 25 mg by mouth 2 (two) times daily. 02/23/23   [provider]      Allergies    Patient has no known allergies.    Review of Systems   Review of Systems  Physical Exam Updated Vital Signs BP (!) 161/83   Pulse 80   Temp (!) 101.5 F (38.6 C) (Rectal)   Resp (!) 29   Ht 5' (1.524 m)   Wt 70.3 kg   SpO2 93%   BMI 30.27 kg/m  Physical Exam Vitals reviewed.  Constitutional:      General: He is not in acute  distress. HENT:     Head: Normocephalic and atraumatic.  Eyes:     Extraocular Movements: Extraocular movements intact.     Conjunctiva/sclera: Conjunctivae normal.     Pupils: Pupils are equal, round, and reactive to light.  Neck:     Vascular: No carotid bruit.  Cardiovascular:     Rate and Rhythm: Normal rate and regular rhythm.     Pulses: Normal pulses.     Heart sounds: Normal heart sounds.     Comments: 2+ bilateral radial/dorsalis pedis pulses with regular rate Pulmonary:     Effort: Pulmonary effort is normal. No respiratory distress.     Breath sounds: Normal breath sounds.  Abdominal:      Palpations: Abdomen is soft.     Tenderness: There is no abdominal tenderness. There is no guarding or rebound.  Musculoskeletal:        General: Normal range of motion.     Cervical back: Normal range of motion and neck supple.     Comments: 5 out of 5 bilateral grip/leg extension strength  Skin:    General: Skin is warm and dry.     Capillary Refill: Capillary refill takes less than 2 seconds.     Comments: No overlying skin color changes  Neurological:     General: No focal deficit present.     Mental Status: He is alert and oriented to person, place, and time.     Sensory: Sensation is intact.     Motor: Motor function is intact.     Coordination: Coordination is intact.     Comments: Sensation intact in all 4 limbs Cranial nerves III through XII intact Vision grossly intact Visual fields grossly intact  Psychiatric:        Mood and Affect: Mood normal.     ED Results / Procedures / Treatments   Labs (all labs ordered are listed, but only abnormal results are displayed) Labs Reviewed  RESP PANEL BY RT-PCR (RSV, FLU A&B, COVID)  RVPGX2 - Abnormal; Notable for the following components:      Result Value   Influenza A by PCR POSITIVE (*)    All other components within normal limits  CBC WITH DIFFERENTIAL/PLATELET - Abnormal; Notable for the following components:   Platelets 126 (*)    All other components within normal limits  COMPREHENSIVE METABOLIC PANEL - Abnormal; Notable for the following components:   Potassium 3.3 (*)    Glucose, Bld 104 (*)    Creatinine, Ser 1.30 (*)    Calcium 8.3 (*)    GFR, Estimated 52 (*)    All other components within normal limits  URINALYSIS, ROUTINE W REFLEX MICROSCOPIC - Abnormal; Notable for the following components:   Hgb urine dipstick MODERATE (*)    Ketones, ur 5 (*)    Protein, ur 30 (*)    Bacteria, UA RARE (*)    All other components within normal limits  I-STAT CHEM 8, ED - Abnormal; Notable for the following components:    Creatinine, Ser 1.30 (*)    Glucose, Bld 101 (*)    Calcium, Ion 1.10 (*)    All other components within normal limits  TROPONIN I (HIGH SENSITIVITY) - Abnormal; Notable for the following components:   Troponin I (High Sensitivity) 24 (*)    All other components within normal limits  TROPONIN I (HIGH SENSITIVITY) - Abnormal; Notable for the following components:   Troponin I (High Sensitivity) 24 (*)    All other components within  normal limits  LIPASE, BLOOD  CBC WITH DIFFERENTIAL/PLATELET  I-STAT CG4 LACTIC ACID, ED  I-STAT CG4 LACTIC ACID, ED    EKG EKG Interpretation Date/Time:  Wednesday March 31 2023 10:14:21 EST Ventricular Rate:  92 PR Interval:  159 QRS Duration:  85 QT Interval:  338 QTC Calculation: 419 R Axis:   20  Text Interpretation: Sinus rhythm Probable left atrial enlargement since last tracing no significant change Confirmed by Rolan Bucco [phone]) on 03/31/2023 11:01:35 AM  Radiology CT Angio Head Neck W WO CM Result Date: 03/31/2023 CLINICAL DATA:  Facial numbness and weakness. Personal history of posterior infarct. EXAM: CT ANGIOGRAPHY HEAD AND NECK WITH AND WITHOUT CONTRAST TECHNIQUE: Multidetector CT imaging of the head and neck was performed using the standard protocol during bolus administration of intravenous contrast. Multiplanar CT image reconstructions and MIPs were obtained to evaluate the vascular anatomy. Carotid stenosis measurements (when applicable) are obtained utilizing NASCET criteria, using the distal internal carotid diameter as the denominator. RADIATION DOSE REDUCTION: This exam was performed according to the departmental dose-optimization program which includes automated exposure control, adjustment of the mA and/or kV according to patient size and/or use of iterative reconstruction technique. CONTRAST:  OMNIPAQUE IOHEXOL 350 MG/ML SOLN COMPARISON:  None Available. FINDINGS: CT HEAD FINDINGS Brain: Age indeterminate lacunar  infarcts are present in the left and right globus pallidus. Diffuse periventricular and subcortical white matter hypoattenuation is present in the anterior frontal lobes bilaterally. A remote infarct is present in the body of the corpus callosum. The deep brain nuclei are otherwise within normal limits. The ventricles are of normal size. No significant extraaxial fluid collection is present. Punctate calcification is present in the lateral left parietal lobe on image 21 of series 3. No acute hemorrhage present. No mass lesion is present. The brainstem and cerebellum are within normal limits. Midline structures are within normal limits. Vascular: No hyperdense vessel or unexpected calcification. Skull: Calvarium is intact. No focal lytic or blastic lesions are present. No significant extracranial soft tissue lesion is present. Sinuses/Orbits: Mild mucosal thickening is present in the inferior right maxillary sinus. No fluid levels are present. Bilateral lens replacements are noted. Globes and orbits are otherwise unremarkable. Review of the MIP images confirms the above findings CTA NECK FINDINGS Aortic arch: Atherosclerotic changes are present at the aortic arch. No significant stenosis is present at the great vessel origins. A 3 vessel arch configuration is present. No dissection is present. Right carotid system: The right common carotid artery is within normal limits. The right carotid bifurcation is unremarkable. The cervical right ICA is normal. Left carotid system: The left common carotid artery is within normal limits. Minimal calcifications are present at the bifurcation without significant stenosis. The cervical left ICA is normal. Vertebral arteries: The left vertebral artery is dominant vessel. Both vertebral arteries originate from the subclavian arteries without significant stenosis. No significant stenosis is present in either vertebral artery in the neck. Skeleton: Multilevel degenerative changes are  present in the cervical spine. Ankylosis is present across the disc space and left posterior elements at C2-3 and C5-6. Slight anterolisthesis is present at C4-5 and C7-T1. Straightening of the normal cervical lordosis is present. No focal osseous lesions are present. Other neck: Soft tissues the neck are otherwise unremarkable. Salivary glands are within normal limits. No significant adenopathy is present. No focal mucosal or submucosal lesions are present. A 2.4 cm nodule is present in the inferior right thyroid lobe. Upper chest: Scattered ground-glass attenuation is present  in the right upper lobe. The lungs are otherwise clear. No pleural effusion or pneumothorax is present. Review of the MIP images confirms the above findings CTA HEAD FINDINGS Anterior circulation: The internal carotid artery is within normal limits from the skull base to the ICA termini. The A1 and M1 segments are normal. The anterior communicating is patent. The MCA bifurcations are normal bilaterally. Moderate stenosis is present in the posterior right M2 division. Mild narrowing is present slightly more distally within an anterior left M3 branch. No obstruction is present. No aneurysm is present. The ACA branch vessels are normal bilaterally. Posterior circulation: The left vertebral artery is dominant vessel. The PICA origins are visualized and normal. The vertebrobasilar junction and basilar artery normal. The superior cerebellar arteries are patent. Both posterior cerebral arteries originate from basilar tip. Moderate stenosis is present in the right P2 segment. The PCA branch vessels are otherwise normal. Venous sinuses: The dural sinuses are patent. The straight sinus and deep cerebral veins are intact. Cortical veins are within normal limits. No significant vascular malformation is evident. Anatomic variants: None Review of the MIP images confirms the above findings IMPRESSION: 1. Age indeterminate lacunar infarcts of the left and  right globus pallidus. MRI may be useful to assess acuity if these correlate clinically. 2. Diffuse periventricular and subcortical white matter hypoattenuation in the anterior frontal lobes bilaterally is nonspecific, but likely reflects the sequela of chronic microvascular ischemia. 3. Remote infarct of the body of the corpus callosum. 4. No acute intracranial abnormality. 5. Moderate stenosis of the posterior right M2 division. 6. Mild narrowing slightly more distally within an anterior left M3 branch. 7. Moderate stenosis of the right P2 segment. 8. No other significant proximal stenosis, aneurysm, or branch vessel occlusion within the Circle of Willis. 9. Minimal atherosclerotic changes at the left carotid bifurcation without significant stenosis. 10. 2.4 cm nodule in the inferior right thyroid lobe. Recommend non-emergent thyroid ultrasound if clinically warranted given patient age. Reference: J Am Coll Radiol. 2015 Feb;12(2): 143-50 11. Multilevel degenerative changes in the cervical spine. 12.  Aortic Atherosclerosis (ICD10-I70.0). Electronically Signed   By: Marin Roberts M.D.   On: 03/31/2023 12:53   CT Angio Chest/Abd/Pel for Dissection W and/or W/WO Result Date: 03/31/2023 CLINICAL DATA:  89 year old male with chest and abdominal pain. Lower extremity weakness. EXAM: CT ANGIOGRAPHY CHEST, ABDOMEN AND PELVIS TECHNIQUE: Non-contrast CT of the chest was initially obtained. Multidetector CT imaging through the chest, abdomen and pelvis was performed using the standard protocol during bolus administration of intravenous contrast. Multiplanar reconstructed images and MIPs were obtained and reviewed to evaluate the vascular anatomy. RADIATION DOSE REDUCTION: This exam was performed according to the departmental dose-optimization program which includes automated exposure control, adjustment of the mA and/or kV according to patient size and/or use of iterative reconstruction technique. CONTRAST:   OMNIPAQUE IOHEXOL 350 MG/ML SOLN COMPARISON:  CTA neck today reported separately. CTA chest abdomen and pelvis 08/24/2022. CT Abdomen and Pelvis 09/18/2012. FINDINGS: CTA CHEST FINDINGS Cardiovascular: Calcified coronary artery atherosclerosis and aortic atherosclerosis redemonstrated. Stable heart size, within normal limits. Small simple fluid density pericardial effusion does not appear significantly changed from last year, significance is doubtful. Following contrast extensive noncalcified aortic atherosclerosis redemonstrated. Cardiac pulsation, negative for thoracic aortic aneurysm or dissection. Little contrast in the pulmonary arterial system but the main pulmonary arteries appear to remain patent. Mediastinum/Nodes: Negative for mediastinal mass or lymphadenopathy. Lungs/Pleura: Similar lung volumes to last year. Major airways are patent. No convincing pleural  effusion. Mild mosaic attenuation with no consolidation or convincing pulmonary inflammation. Musculoskeletal: Mild respiratory motion limits some rib detail. Lower cervical and upper thoracic vertebral endplate degeneration again noted. No acute osseous abnormality identified. Review of the MIP images confirms the above findings. CTA ABDOMEN AND PELVIS FINDINGS VASCULAR Aortoiliac calcified atherosclerosis. Normal caliber abdominal aorta. Negative for aortic dissection. Asymmetric chronic atherosclerosis perhaps with a small chronic short segment dissection at the right Common iliac artery bifurcation appears unchanged. Major arterial structures in the abdomen and pelvis remain patent. No acute arterial finding. Review of the MIP images confirms the above findings. NON-VASCULAR Hepatobiliary: Polycystic liver disease appears stable from last year, most pronounced in the left lobe (no follow-up imaging recommended). Motion artifact limits detail of the gallbladder which appears grossly normal. No perihepatic free fluid. Pancreas: Negative. Spleen:  Negative. Adrenals/Urinary Tract: Adrenal glands are stable and within normal limits. Nonobstructed kidneys enhance symmetrically. Small benign appearing renal cysts again noted (no follow-up imaging recommended). Decompressed ureters. Unremarkable bladder. Stomach/Bowel: Diverticulosis redemonstrated at the distal descending and proximal sigmoid colon with no active inflammation. Below average volume of retained stool in the large bowel. No dilated loops. Normal appendix on coronal image 87 of series 13. No free air, free fluid, or convincing bowel inflammation. Stomach and duodenum are decompressed. Lymphatic: No lymphadenopathy identified. Reproductive: Negative. Other: No pelvis free fluid. Musculoskeletal: Generally mild for age lumbar spine degeneration, chronically advanced at the lumbosacral junction. No acute osseous abnormality identified. Review of the MIP images confirms the above findings. IMPRESSION: Aortic Atherosclerosis (ICD10-I70.0), Negative for aortic Aneurysm or dissection. And no acute or inflammatory process identified in the chest, abdomen, or pelvis. Normal appendix. Electronically Signed   By: Odessa Fleming M.D.   On: 03/31/2023 12:38    Procedures Procedures    Medications Ordered in ED Medications  iohexol (OMNIPAQUE) 350 MG/ML injection 100 mL (100 mLs Intravenous Contrast Given 03/31/23 1209)  acetaminophen (TYLENOL) tablet 650 mg (650 mg Oral Given 03/31/23 1221)    ED Course/ Medical Decision Making/ A&P                                 Medical Decision Making Amount and/or Complexity of Data Reviewed Labs: ordered. Radiology: ordered.  Risk OTC drugs.   Todd [name] 89 y.o. presented today for weakness, paresthesias. Working DDx that I considered at this time includes, but not limited to, vascular occlusion, ischemic limb, CVA, PE, ACS, electrolyte abnormalities, UTI, dissection, viral illness.  R/o DDx: vascular occlusion, ischemic limb,  PE, ACS,  electrolyte abnormalities, UTI, dissection: These are considered less likely due to history of present illness, physical exam, labs/imaging findings  Review of prior external notes: 03/22/2023 discharge  Unique Tests and My Interpretation:  CBC: Unremarkable CMP: Unremarkable Lipase: Unremarkable Troponin: 24, 24 UA: Unremarkable Respiratory panel: Flu is a positive Lactic acid: 1.5 I-STAT Chem-8: Unremarkable EKG: Sinus 90 bpm, no ST elevations or depressions noted, no signs of right heart strain CTA head and neck: Age-indeterminate lacunar infarcts in left and right globus pallidus do recommend MRI to further evaluate; no acute infarcts or occlusions noted CTA dissection study: Aortic atherosclerosis MRI brain without contrast: Pending  Social Determinants of Health: none  Discussion with Independent Historian:  Daughters  Discussion of Management of Tests:  Bhagat, MD Neurologist ; Sherrilee Gilles, MD IM Resident  Risk: High: hospitalization or escalation of hospital-level care  Risk Stratification Score: None  Staffed with  Belfi, MD  Plan: On exam patient was in no acute distress stable vitals.  Patient was neuro vas intact in all 4 extremities and does not appear to have ischemic limbs.  Patient does have strong pulses in all 4 extremities.  Patient did have generalized abdominal tenderness without peritoneal signs on exam.  Patient's neuroexam was ultimately reassuring as his visual fields are intact along with no slurring of words and reassuring finger-to-nose.  Given patient's recent history of P2 occlusion and suspected vascular cause for patient's pain we will obtain CTA of the head and neck along with dissection study and obtain labs.  Patient's rectal temp is 101.5 F and so we will get Tylenol for him.  If imaging is negative patient could have viral illness from his hospitalization.  Patient's labs do show that he has influenza A which would explain his symptoms.  So awaiting  imaging to return however patient is outside of window for Tamiflu.  Labs and imaging show age-indeterminate locular infarcts and recommend MRI to further evaluate.  Patient does not have any neurologic deficits and I spoke to the neurologist on-call.  Daughter states that patient is not gone the loop recorder yet and so after speaking with neurology they recommend getting the MRI of the brain to further evaluate as last time patient had unimpressive exam with acute findings on MRI.  Do feel patient will benefit from admission and so we will consult hospitalist.  I spoke to the hospitalist and patient was accepted for admission.  Patient stable for admission.  This chart was dictated using voice recognition software.  Despite best efforts to proofread,  errors can occur which can change the documentation meaning.         Final Clinical Impression(s) / ED Diagnoses Final diagnoses:  Influenza A    Rx / DC Orders ED Discharge Orders     None         Remi Deter 03/31/23 1341    Rolan Bucco, MD 03/31/23 1546

## 2023-03-31 NOTE — Plan of Care (Addendum)
Asked for recommendations regarding utility of MRI  Patient reporting facial numbness, here with influenza, recent stroke workup last week but did not yet receive recommended 30 day event monitor, is on DAPT  CTA report reviewed 1. Age indeterminate lacunar infarcts of the left and right globus pallidus. MRI may be useful to assess acuity if these correlate clinically. 2. Diffuse periventricular and subcortical white matter hypoattenuation in the anterior frontal lobes bilaterally is nonspecific, but likely reflects the sequela of chronic microvascular ischemia. 3. Remote infarct of the body of the corpus callosum. 4. No acute intracranial abnormality. 5. Moderate stenosis of the posterior right M2 division. 6. Mild narrowing slightly more distally within an anterior left M3 branch. 7. Moderate stenosis of the right P2 segment. 8. No other significant proximal stenosis, aneurysm, or branch vessel occlusion within the Circle of Willis. 9. Minimal atherosclerotic changes at the left carotid bifurcation without significant stenosis. 10. 2.4 cm nodule in the inferior right thyroid lobe. Recommend non-emergent thyroid ultrasound if clinically warranted given patient age. Reference: J Am Coll Radiol. 2015 Feb;12(2): 143-50 11. Multilevel degenerative changes in the cervical spine. 12.  Aortic Atherosclerosis (ICD10-I70.0).  Okay to repeat MRI brain as evidence for further stroke would indicate need for further telemetry monitoring inpatient / potentially reinforce need for further outpatient cardiac monitoring and escalation to anticoagulation potentially.   Even if MRI negative would recommend placement of cardiac monitor prior to discharge, as this was requested on last admission.   Brooke Dare MD-PhD [facility] [phone]  Available 7 AM to 7 PM, outside these hours please contact Neurologist on call listed on AMION   9 min spent in care of patient, majority in  discussion with ED PA, PA Schuman notifed patient of neurological consultation    These are curbside recommendations based upon the information readily available in the chart on brief review as well as history and examination information provided to me by requesting provider and do not replace a full detailed consult

## 2023-03-31 NOTE — ED Notes (Signed)
Patient transported to CT 

## 2023-03-31 NOTE — ED Notes (Signed)
CCMD called to initiate patient monitoring.

## 2023-03-31 NOTE — ED Triage Notes (Signed)
Pt to ED via [facility] from home. Pt c/o pain all over and numbness all over body. Family states he has also had dizziness. Pt has been for same but has worsened in past 5 days. Pt denies nausea or vomiting.

## 2023-03-31 NOTE — H&P (Cosign Needed Addendum)
Date: 03/31/2023         Patient Name:  Todd [name] MRN: [id number]  DOB: 1930-05-22 Age / Sex: 89 y.o., male   PCP: [facility]         Medical Service: Internal Medicine Teaching Service         Attending Physician: Dr. Mercie Eon, MD    First Contact: Dr. Dr. Laretta Bolster, MD Pager [phone] Pager: [phone]  Second Contact: Dr. Dr. Rana Snare, DO Pager [phone] Pager: [phone]       After Hours (After 5p/  First Contact Pager: [phone]  weekends / holidays): Second Contact Pager: [phone]   Chief Concern: Generalized weakness and stroke like sxs.  History of Present Illness: Mr. Lycett is a 89 year old male with a history of PCI stroke and hypertension, who presented with a 4-day history of dry cough and generalized weakness. The majority of the history was obtained from the patient's sister, who was at his bedside, as the patient appeared somnolent and lethargic.  Notably, the patient was recently admitted and discharged on January 15 for a PCA stroke and is on dual antiplatelet therapy, with the family confirming adherence to the medication regimen. He was in his usual state of health until last Friday when he felt "cold." He started experiencing a dry cough on Saturday. The patient denied having nasal regurgitation or a sore throat. His mother experienced similar symptoms and was diagnosed with the influenza virus by her primary care physician. He tried chamomile tea, which provided some relief. He has not had any fevers, shortness of breath, or swelling in his extremities. He saw his primary care physician for a hospital follow-up yesterday, January 21, and had a good dinner with his family.  This morning, the patient was observed to have generalized weakness. He denies experiencing facial drooping, vision changes, neck pain, or headaches.  He was brought in to ED, to be further evaluated.   Allergies: None   Past Medical  History: Hypertension Right PCA stroke Hyperlipidemia  GERD  Medications: - Amlodipine 5 mg - Aspirin 81 mg - Atorvastatin 40 mg - Clopidogrel 75 mg - Metoprolol 25 mg twice daily  Surgical History:  None   Family History:  Heart disease -brother  Social History:  - Lives with daughter in [place]. - Dependent of ADLs and independent of IADLs.  Just - Former smoker, quit 30 years ago, no alcohol or recreational drug use. - PCP : [facility].   Physical Exam:  Blood pressure (!) 161/83, pulse 80, temperature (!) 101.5 F (38.6 C), temperature source Rectal, resp. rate (!) 29, height 5' (1.524 m), weight 70.3 kg, SpO2 93%.   Constitutional: ill-appearing, lethargic/somnolent, NAD. Cardiovascular: regular rate and rhythm, no m/r/g,  Pulmonary/Chest: normal work of breathing on room air, lungs clear to auscultation bilaterally.  No crackles  Abdominal: soft, non-tender, normal bowel sounds bilaterally. Neurological: Not alert and oriented.  Moving extremities, able to squeeze hands and move toes bilaterally.  MSK: no gross abnormalities.  No pitting edema Skin: warm and dry  EKG:  Normal sinus rhythm, with a probable left atrial enlargement, unchanged from prior.   Images and other studies:  Imaging: CT Angio Head Neck W WO CM  IMPRESSION: 1. Age indeterminate lacunar infarcts of the left and right globus pallidus. MRI may be useful to assess acuity if these correlate clinically. 2. Diffuse periventricular and subcortical white matter hypoattenuation in the anterior frontal lobes bilaterally is nonspecific, but likely  reflects the sequela of chronic microvascular ischemia. 3. Remote infarct of the body of the corpus callosum.  2.4 cm nodule in the inferior right thyroid lobe.  CT Angio Chest/Abd/Pel for Dissection W and/or W/WO No acute abnormality.  MRI brain without contrast IMPRESSION: 1. Linear area of restricted diffusion in the posterior  right occipital lobe compatible with acute infarct. 2. Remote lacunar infarcts of the left thalamus and posterior limb right internal capsule. 3. Foci of susceptibility in the cerebellum bilaterally, right greater than left. These may represent cavernomas. 4. Degenerative changes of the upper cervical spine, most evident at C5-6.    Assessment & Plan:  Todd [name] is a 89 y.o. male with a hx of PCI infarct, hypertension presented with generalized weakness and  cough and admitted for infection with influenza virus and new acute infarct of right occipital lobe.  Active Problems:   Numbness of face  # Acute infarct of right occipital lobe. # Prior PCA stroke Acute onset of generalized fatigue and weakness.  Last known normal was 1/21. Unable to complete a full neuro exam due to lethargy/somnolence, 2/2 due ativan he received for the MRI. CT angio concerning for lacunar infarct in the left and right globus pallidus, MRI of the brain showed  slight extension of the previously seen left occipital lobe stroke.  HbA1c 5.8, normal EKG . He has not yet been placed on outpatient cardiac monitoring as was recommended at discharge on 1/15.   Plan:  - Neurology following, appreciate rec's. - Cardiac monitoring.  - DAPT therapy with aspirin and clopidogrel - PT/OT/SLP evaluation.  # Acute influenza virus infection # cough # Weakness Presenting with fatigue, generalized weakness, and dry cough.  At the ED, had a fever to 101.5, tachypneic, satting on room air.  No leukocytosis.  Lungs clear bilaterally, no wheezes or crackles.  Diagnoses that were considered includes Viral etiology vs. CAP vs. asthma/COPD.  Presence of clear lungs argues against COPD/asthma and bacterial pneumonia.    Plan:  - Tamiflu  - Cough suppressant with benzonatate  #HTN Hypertensive on presentation, BP 161/83.  He is on amlodipine 5 mg and metoprolol 25 mg twice daily at home.  Will hold antihypertensives to allow  for permissive hypertension.  Blood pressure < 220/120.  Plan: - permissive HTN for 24 hrs, then reduce goal by 20% per day to 130-140. - Hold metoprolol - Hold amlodipine.  #Elevated creatinine # Hypokalemia SCr 1.30, potassium 3.3.  baseline Scr 1.1  Suspect prerenal  due to decreased intake.  Will treat with  0.5 L of LR  bolus  Plan: - BMP - Avoid nephrotoxic drug.  #HLD Most recent LDL 78, on atorvastatin 40 mg.    - Cw atorvastatin 40 mg.    #Thyroid nodules Incidental finding of multiple hypoenhancing nodules in the thyroid, recommending non-emergent ultrasound to further evaluate.  -Follow-up outpatient.  Diet: NPO  IVF: 500cc of LR IV bolus. VTE: Enoxaparin Code: Full  Laretta Bolster, M.D.  Internal Medicine Resident, PGY-1 [facility]  Pager: [phone] 2:32 PM, 03/31/2023   **Please contact the on call pager after 5 pm and on weekends at [phone].**

## 2023-03-31 NOTE — Hospital Course (Signed)
Todd [name] is a 89 y.o. male with a hx of PCI infarct, hypertension presented with generalized weakness and  cough and admitted for infection with influenza virus and with brain imaging showing extension of recent L occipital stroke.  Active Problems:   Numbness of face   # Acute infarct of right occipital lobe. # Prior PCA stroke Acute onset of generalized left sided weakness, and tingling/numbness.  No new focal neurologic deficit on exams.  CT angio concerning for lacunar infarct in the left and right globus pallidus, MRI of the brain showed slight extension of the previously seen left occipital lobe stroke.  HbA1c 5.8, normal EKG. I spoke to cardiology, prior event monitor was not ordered.  Patient will receive event monitor at least in the next 5-7 days.  Will continue on aspirin and clopidogrel.  Neurology also recommended restarting blood pressure meds amlodipine and metoprolol.  - Clopidogrel for 3 months, continue with aspirin indefinitely.   - 30-day event cardiac monitoring  # Acute influenza virus infection # cough # Weakness Pt with  fatigue, generalized weakness, and dry cough.   Had a fever to 101.5, no leukocytosis, clear lungs bilaterally, positive for the flu.  Received treatment with Tamiflu, will need to complete 4 more days of Tamiflu 30 mg BID  - Tamiflu 30 mg BID for 4 days. - Cough suppressant with benzonatate   #HTN Hypertensive on presentation, BP 161/83.  We held amlodipine and metoprolol on presentation.  Restarted prior to discharge, per neurologies recommendations.  -Continue amlodipine 5 mg  -Continue metoprolol 25 mg BID.   #Elevated creatinine # Hypokalemia SCr mildly elevated, stable on discharge.   #HLD Continued with atorvastatin 40 mg.  #Thyroid nodules Incidental finding of multiple hypoenhancing nodules in the thyroid, recommending non-emergent ultrasound to further evaluate.  -Follow-up outpatient.

## 2023-04-01 ENCOUNTER — Other Ambulatory Visit: Payer: Self-pay | Admitting: Neurology

## 2023-04-01 ENCOUNTER — Other Ambulatory Visit (HOSPITAL_COMMUNITY): Payer: Self-pay

## 2023-04-01 ENCOUNTER — Other Ambulatory Visit: Payer: Self-pay | Admitting: Student

## 2023-04-01 ENCOUNTER — Encounter: Payer: Self-pay | Admitting: Radiology

## 2023-04-01 DIAGNOSIS — I639 Cerebral infarction, unspecified: Secondary | ICD-10-CM

## 2023-04-01 DIAGNOSIS — J101 Influenza due to other identified influenza virus with other respiratory manifestations: Secondary | ICD-10-CM | POA: Diagnosis not present

## 2023-04-01 LAB — CBC
HCT: 41.3 % (ref 39.0–52.0)
Hemoglobin: 13.7 g/dL (ref 13.0–17.0)
MCH: 27 pg (ref 26.0–34.0)
MCHC: 33.2 g/dL (ref 30.0–36.0)
MCV: 81.5 fL (ref 80.0–100.0)
Platelets: 121 10*3/uL — ABNORMAL LOW (ref 150–400)
RBC: 5.07 MIL/uL (ref 4.22–5.81)
RDW: 13.4 % (ref 11.5–15.5)
WBC: 5.3 10*3/uL (ref 4.0–10.5)
nRBC: 0 % (ref 0.0–0.2)

## 2023-04-01 LAB — BASIC METABOLIC PANEL
Anion gap: 11 (ref 5–15)
BUN: 10 mg/dL (ref 8–23)
CO2: 23 mmol/L (ref 22–32)
Calcium: 7.7 mg/dL — ABNORMAL LOW (ref 8.9–10.3)
Chloride: 98 mmol/L (ref 98–111)
Creatinine, Ser: 1.16 mg/dL (ref 0.61–1.24)
GFR, Estimated: 60 mL/min — ABNORMAL LOW (ref >60)
Glucose, Bld: 90 mg/dL (ref 70–99)
Potassium: 3 mmol/L — ABNORMAL LOW (ref 3.5–5.1)
Sodium: 132 mmol/L — ABNORMAL LOW (ref 135–145)

## 2023-04-01 LAB — MAGNESIUM: Magnesium: 1.9 mg/dL (ref 1.7–2.4)

## 2023-04-01 MED ORDER — OSELTAMIVIR PHOSPHATE 75 MG PO CAPS
75.0000 mg | ORAL_CAPSULE | Freq: Once | ORAL | Status: AC
  Administered 2023-04-01: 75 mg via ORAL
  Filled 2023-04-01: qty 1

## 2023-04-01 MED ORDER — POTASSIUM CHLORIDE CRYS ER 20 MEQ PO TBCR
40.0000 meq | EXTENDED_RELEASE_TABLET | Freq: Once | ORAL | Status: AC
  Administered 2023-04-01: 40 meq via ORAL
  Filled 2023-04-01: qty 2

## 2023-04-01 MED ORDER — OSELTAMIVIR PHOSPHATE 30 MG PO CAPS: 30.0000 mg | ORAL_CAPSULE | Freq: Two times a day (BID) | ORAL | Status: DC

## 2023-04-01 MED ORDER — OSELTAMIVIR PHOSPHATE 30 MG PO CAPS
30.0000 mg | ORAL_CAPSULE | Freq: Two times a day (BID) | ORAL | 0 refills | Status: AC
  Filled 2023-04-01: qty 8, 4d supply, fill #0

## 2023-04-01 NOTE — Consult Note (Signed)
NEUROLOGY CONSULT NOTE   Date of service: April 01, 2023 Patient Name: Todd [name] MRN:  [id number] DOB:  29-Sep-1930 Chief Complaint: Diffuse weakness Requesting Provider: Mercie Eon, MD  History of Present Illness  Todd [name] is a 89 y.o. male with a PMHx of GERD, HLD, HTN and recent prior admission for stroke (discharged on 1/15), who presented back to the ED on Wednesday with c/o pain and numbness all over his body. Family states he has also had dizziness and BLE weakness. He was hypertensive on admission with BP of 161/83. EKG in the ED showed sinus rhythm and probable left atrial enlargement, unchanged from prior. His K was low at 3.3. No leukocytosis and lungs were clear to auscultation. He was noted to be febrile in the ED and influenza test was positive. Exam by EDP showed no neurological deficits.   MRI revealed the following: 1. Linearly distributed small foci of restricted diffusion in the posterior right occipital lobe compatible with acute infarcts. There appears to have been slight extension relative to the recent prior MRI.  2. Remote lacunar infarcts of the left thalamus and posterior limb right internal capsule. 3. Foci of susceptibility in the cerebellum bilaterally, right greater than left. These may represent cavernomas.   Of note, he did not yet receive recommended 30 day event monitor after his discharge on 1/15.   A summary of his neurological condition and presenting complaints at last admission has been reviewed (from Dr. Marlis Edelson note): "presenting with a transient episode of right leg weakness and numbness as well as apparent left hemianopsia.  The day before yesterday, he had difficulty feeling his right foot on the floor and had some difficulty with ambulation.  He then went to sleep, and when he woke up he had trouble feeling the right half of his body.  His daughter brought him to the ED, where he was found to have acute ischemic infarct in the  right hippocampus and right occipital lobe.  CT angiogram demonstrated occlusion of the right P2 segment.  Left hemianopsia is evident on exam. NIH on Admission 2." He had been discharged home on ASA and Plavix. He is also on Lipitor.   Summary of stroke work up obtained last admission:  CT head No acute abnormality. Small vessel disease. Atrophy.  CTA head & neck complete occlusion of proximal right P2 segment with reconstitution of mid to distal right P2, severe stenosis in bilateral proximal M2 branches, moderate stenosis in right M1,  MRI small areas of restricted diffusion in right hippocampus and right occipital lobe 2D Echo ejection fraction 65 to 70%. HgbA1c 5.8  The [place] from [place]    ROS  As per HPI. He does not endorse additional complaints during bedside interview.   Past History   Past Medical History:  Diagnosis Date   GERD (gastroesophageal reflux disease)    Hyperlipidemia    Hypertension     History reviewed. No pertinent surgical history.  Family History: Family History  Problem Relation Age of Onset   Arthritis Mother    Heart disease Brother    Heart disease Brother    Heart disease Brother    Heart disease Brother    Heart disease Brother    Hyperlipidemia Son     Social History  reports that he has never smoked. He has never used smokeless tobacco. He reports that he does not drink alcohol and does not use drugs.  No Known Allergies  Medications  Current Facility-Administered Medications:    acetaminophen (TYLENOL) tablet 650 mg, 650 mg, Oral, Q6H PRN **OR** acetaminophen (TYLENOL) suppository 650 mg, 650 mg, Rectal, Q6H PRN, Rana Snare, DO   aspirin EC tablet 81 mg, 81 mg, Oral, Daily, Rana Snare, DO   atorvastatin (LIPITOR) tablet 40 mg, 40 mg, Oral, Daily, Rana Snare, DO   benzonatate (TESSALON) capsule 100 mg, 100 mg, Oral, TID PRN, Rana Snare, DO   clopidogrel (PLAVIX) tablet 75 mg, 75 mg, Oral,  Daily, Rana Snare, DO   enoxaparin (LOVENOX) injection 40 mg, 40 mg, Subcutaneous, Q24H, Rana Snare, DO   oseltamivir (TAMIFLU) capsule 75 mg, 75 mg, Oral, Once **FOLLOWED BY** oseltamivir (TAMIFLU) capsule 30 mg, 30 mg, Oral, BID, Mercie Eon, MD   senna-docusate (Senokot-S) tablet 1 tablet, 1 tablet, Oral, QHS PRN, Rana Snare, DO  Current Outpatient Medications:    amLODipine (NORVASC) 5 MG tablet, Take 1 tablet (5 mg total) by mouth daily., Disp: 60 tablet, Rfl: 1   aspirin EC 81 MG tablet, Take 1 tablet (81 mg total) by mouth daily. Swallow whole., Disp: 60 tablet, Rfl: 12   atorvastatin (LIPITOR) 40 MG tablet, Take 1 tablet (40 mg total) by mouth daily., Disp: 60 tablet, Rfl: 1   clopidogrel (PLAVIX) 75 MG tablet, Take 1 tablet (75 mg total) by mouth daily., Disp: 90 tablet, Rfl: 0   metoprolol tartrate (LOPRESSOR) 25 MG tablet, Take 25 mg by mouth 2 (two) times daily., Disp: , Rfl:    ondansetron (ZOFRAN-ODT) 4 MG disintegrating tablet, Take 4 mg by mouth every 8 (eight) hours as needed., Disp: , Rfl:   Vitals   Vitals:   April 02, 2023 1458 04/02/23 1845 04/02/23 2300 04/02/2023 2331  BP: 137/62 (!) 154/76 (!) 130/59 137/62  Pulse: 71 85 80 81  Resp: (!) 24 (!) 28 (!) 27 (!) 22  Temp: 97.6 F (36.4 C)   99.1 F (37.3 C)  TempSrc: Oral   Axillary  SpO2: 99% 98% 94% 96%  Weight:      Height:        Body mass index is 30.27 kg/m.  Physical Exam   Physical Exam  HEENT-  /AT    Lungs- Respirations unlabored Extremities- No edema   Neurological Examination Mental Status: Alert, oriented to state but not the city. Oriented to the day of the week and the season, but not the year or the month. Speech fluent with intact naming and comprehension (in Spanish).  Able to follow all commands without difficulty. Cranial Nerves: II: Poorly cooperative with testing of visual fields. Blinks to threat briskly in temporal field of right eye but not in temporal field of left eye.  Can count fingers and identify objects with central vision of each eye individually. PERRL  III,IV, VI: Chronic left ptosis. EOM are conjugate and full. No nystagmus. V: FT sensation equal bilaterally  VII: Smile symmetric VIII: Hearing intact to voice IX,X: No hoarseness XI: Symmetric shoulder shrug XII: Midline tongue extension Motor: BUE 5/5 proximally and distally BLE 5/5 proximally and distally  No pronator drift.  Sensory: Light touch intact throughout, bilaterally.   Deep Tendon Reflexes: 1+ bilateral brachioradialis. 2+ bilateral patellars.  Cerebellar: No ataxia with FNF bilaterally  Gait: Deferred   Labs/Imaging/Neurodiagnostic studies   CBC:  Recent Labs  Lab Apr 02, 2023 1025 04-02-23 1053  WBC 6.1  --   NEUTROABS 4.6  --   HGB 14.5 15.0  HCT 44.1 44.0  MCV 81.7  --   PLT 126*  --  Basic Metabolic Panel:  Lab Results  Component Value Date   NA 138 03/31/2023   K 3.5 03/31/2023   CO2 22 03/31/2023   GLUCOSE 101 (H) 03/31/2023   BUN 13 03/31/2023   CREATININE 1.30 (H) 03/31/2023   CALCIUM 8.3 (L) 03/31/2023   GFRNONAA 52 (L) 03/31/2023   GFRAA 74 10/01/2017   Lipid Panel:  Lab Results  Component Value Date   LDLCALC 78 03/23/2023   HgbA1c:  Lab Results  Component Value Date   HGBA1C 5.8 (H) 03/23/2023   Urine Drug Screen: No results found for: "LABOPIA", "COCAINSCRNUR", "LABBENZ", "AMPHETMU", "THCU", "LABBARB"  Alcohol Level     Component Value Date/Time   ETH <10 03/22/2023 1115   INR  Lab Results  Component Value Date   INR 1.1 03/22/2023   APTT  Lab Results  Component Value Date   APTT 30 03/22/2023    CT angio Head and Neck with contrast: 1. Age indeterminate lacunar infarcts of the left and right globus pallidus. MRI may be useful to assess acuity if these correlate clinically. 2. Diffuse periventricular and subcortical white matter hypoattenuation in the anterior frontal lobes bilaterally is nonspecific, but likely reflects  the sequela of chronic microvascular ischemia. 3. Remote infarct of the body of the corpus callosum. 4. No acute intracranial abnormality. 5. Moderate stenosis of the posterior right M2 division. 6. Mild narrowing slightly more distally within an anterior left M3 branch. 7. Moderate stenosis of the right P2 segment. 8. No other significant proximal stenosis, aneurysm, or branch vessel occlusion within the Circle of Willis. 9. Minimal atherosclerotic changes at the left carotid bifurcation without significant stenosis. 10. 2.4 cm nodule in the inferior right thyroid lobe. Recommend non-emergent thyroid ultrasound if clinically warranted given patient age. Reference: J Am Coll Radiol. 2015 Feb;12(2): 143-50 11. Multilevel degenerative changes in the cervical spine. 12.  Aortic Atherosclerosis (ICD10-I70.0).    ASSESSMENT  89 year old male with recent prior small left occipital lobe stroke (discharged on 1/15), re-presenting with complaints of malaise, chills and diffuse weakness. He has been diagnosed with influenza virus infection. MRI shows slight extension of the previously seen left occipital lobe stroke. No new acute neurological process is suspected. He has not yet been placed on outpatient cardiac monitoring as was recommended at discharge on 1/15.  - Neurological exam reveals left sided visual field cut. Strength is normal x 4. No facial droop or sensory loss.  - HgbA1c elevated at 5.8.  - CTA as documented above.  - Overall impression:  - Diffuse weakness secondary to intercurrent infection.  - Extension of recently diagnosed left occipital lobe stroke, which can be expected in some patients with an occluded arterial segment supplying that territory (has moderate stenosis of the right P2 segment per CTA this admission, which was read as occluded on CTA at prior admission) - Multifocal intracranial atherosclerosis   RECOMMENDATIONS  - Would recommend placement of cardiac monitor  prior to discharge to assess for possible intermittent atrial fibrillation/flutter, as this was requested on last admission. - Management of influenza infection and HTN per primary team.  - Permissive HTN x an additional 24 hours, then reduce by 20% per day to goal of 130-140/90 - PT/OT/Speech - No indication for repeating his TTE - Cardiac telemetry.  - Stroke Team to follow in the AM.   ______________________________________________________________________    Dessa Phi, Riddick Nuon, MD [facility]

## 2023-04-01 NOTE — Progress Notes (Addendum)
STROKE TEAM PROGRESS NOTE   BRIEF HPI Mr. Todd [name] is a 89 y.o. male with PMH significant for HTN, HLD, recent admission for stroke (discharged on 1/15) who presented to ED on 1/22 wd/t generalized weakness and numbness. On admission, he was hypertensive and febrile. Influenza test positive. Exam in ed showed no neurological deficits.  MRI shows slight extension relative to prior MRI and acute infarcts right occipital lobe.  NIH on Admission: 0  INTERIM HISTORY/SUBJECTIVE Daughters at bedside, interpreter utilized during exam.  No focal deficits seen on exam. Generalized weakness.   OBJECTIVE  CBC    Component Value Date/Time   WBC 5.3 04/01/2023 0522   RBC 5.07 04/01/2023 0522   HGB 13.7 04/01/2023 0522   HGB 15.7 01/05/2017 1621   HCT 41.3 04/01/2023 0522   HCT 47.0 01/05/2017 1621   PLT 121 (L) 04/01/2023 0522   PLT 199 01/05/2017 1621   MCV 81.5 04/01/2023 0522   MCV 80 01/05/2017 1621   MCH 27.0 04/01/2023 0522   MCHC 33.2 04/01/2023 0522   RDW 13.4 04/01/2023 0522   RDW 15.0 01/05/2017 1621   LYMPHSABS 0.8 03/31/2023 1025   LYMPHSABS 1.8 01/05/2017 1621   MONOABS 0.7 03/31/2023 1025   EOSABS 0.0 03/31/2023 1025   EOSABS 0.2 01/05/2017 1621   BASOSABS 0.0 03/31/2023 1025   BASOSABS 0.1 01/05/2017 1621    BMET    Component Value Date/Time   NA 132 (L) 04/01/2023 0522   NA 140 10/01/2017 1652   K 3.0 (L) 04/01/2023 0522   CL 98 04/01/2023 0522   CO2 23 04/01/2023 0522   GLUCOSE 90 04/01/2023 0522   BUN 10 04/01/2023 0522   BUN 9 10/01/2017 1652   CREATININE 1.16 04/01/2023 0522   CREATININE 1.06 09/17/2020 0900   CALCIUM 7.7 (L) 04/01/2023 0522   GFRNONAA 60 (L) 04/01/2023 0522    IMAGING past 24 hours MR Brain Wo Contrast (neuro protocol) Result Date: 03/31/2023 CLINICAL DATA:  Five-day history of bilateral leg and arm numbness. Facial numbness. Chest pain and shortness of breath. Recent diagnosis of posterior circulation infarct. EXAM: MRI HEAD  WITHOUT CONTRAST TECHNIQUE: Multiplanar, multiecho pulse sequences of the brain and surrounding structures were obtained without intravenous contrast. COMPARISON:  CT angio head and neck 03/31/2023 FINDINGS: Brain: A linear area of restricted diffusion is present in the posterior right occipital lobe. Foci of susceptibility are present in the cerebellum bilaterally, right greater than left. These may represent cavernomas. Remote lacunar infarcts are present in the left thalamus and posterior limb right internal capsule. Dilated perivascular spaces are present throughout the basal ganglia. No other acute infarct or hemorrhage is present. The ventricles are of normal size. No significant extraaxial fluid collection is present. Brainstem and cerebellum are otherwise within normal limits. The internal auditory canals are within normal limits. Midline structures are within normal limits. Vascular: Flow is present in the major intracranial arteries. Skull and upper cervical spine: Degenerative changes are present the upper cervical spine, most evident at C5-6. Sinuses/Orbits: Mild mucosal thickening is present the maxillary sinuses and anterior ethmoid air cells bilaterally. A right lens replacement is present. The globes and orbits are otherwise within normal limits. IMPRESSION: 1. Linear area of restricted diffusion in the posterior right occipital lobe compatible with acute infarct. 2. Remote lacunar infarcts of the left thalamus and posterior limb right internal capsule. 3. Foci of susceptibility in the cerebellum bilaterally, right greater than left. These may represent cavernomas. 4. Degenerative changes of the upper  cervical spine, most evident at C5-6. Electronically Signed   By: Marin Roberts M.D.   On: 03/31/2023 14:42    Vitals:   04/01/23 0645 04/01/23 0800 04/01/23 0825 04/01/23 1000  BP:  (!) 151/70  (!) 125/109  Pulse: 88 85  84  Resp: (!) 29 (!) 23  (!) 24  Temp:   98.5 F (36.9 C)    TempSrc:   Oral   SpO2: 98% 95%  100%  Weight:      Height:         PHYSICAL EXAM General:  Alert, well-nourished, well-developed patient in no acute distress Psych:  Mood and affect appropriate for situation CV: Regular rate and rhythm on monitor Respiratory:  Regular, unlabored respirations on room air GI: Abdomen soft and nontender   NEURO:  Mental Status: AA&Ox3, patient is able to give clear and coherent history Speech/Language: speech is without dysarthria or aphasia.  Naming, repetition, fluency, and comprehension intact.  Cranial Nerves:  II: PERRL. Visual fields full.  III, IV, VI: EOMI. Eyelids elevate symmetrically.  V: Sensation is intact to light touch and symmetrical to face.  VII: Face is symmetrical resting and smiling VIII: hearing intact to voice. IX, X: Palate elevates symmetrically. Phonation is normal.  WU:[id number] shrug 5/5. XII: tongue is midline without fasciculations. Motor: Generalized weakness. No focal deficits.  Tone: is normal and bulk is normal Sensation- Intact to light touch bilaterally. Extinction absent to light touch to DSS.   Coordination: FTN intact bilaterally, weak.   Gait- deferred  Most Recent NIH: 2    ASSESSMENT/PLAN  Stroke: Acute small infarct right occipital lobe, slight extension of recent left occipital lobe infarct Etiology:  likely embolic given significant patency of R PCA on repeat CTA Code Stroke CT head: No acute abnormality. Small vessel disease. Atrophy.  CTA head & neck Moderate stenosis of the right P2 segment, now much patent from recent CTA, concerning for embolic source of R PCA. Moderate stenosis of the posterior right M2 division. Mild narrowing slightly more distally within an anterior left M3 branch. MRI  Linear area of restricted diffusion in the posterior right occipital lobe compatible with acute infarct. 30 day cardiac event monitoring recommended VTE prophylaxis - lovenox aspirin 81 mg daily and  clopidogrel 75 mg daily prior to admission, now continue home aspirin 81 mg daily and clopidogrel 75 mg daily for 3 months and then aspirin alone.  Therapy recommendations:  No follow up needed  Disposition:  likely discharge home  Recent stroke: R PCA infarct due to R PCA occlusion Discharged 1/15 from [facility]: right Occipital scattered Acute Infarcts CTA head and neck showed complete occlusion in the proximal right P2 segment, with reconstitution in the mid to distal right P2. Somewhat diminished opacification in the right P3 branches. 2. Severe stenosis in a bilateral proximal M2 branches. Moderate stenosis in the right M1 and mild stenosis in the left M1. 2D Echo: EF 65-70%, Mild LVH, Small pericardial effusion, AV sclerosis present LDL 78 HgbA1c 5.8 Placed on Aspirin and Plavix for 3 months and then Aspirin alone.  30-day cardiac monitoring recommended last admission  Hx of silent strokes on images CT head Age indeterminate lacunar infarcts of the left and right globus pallidus. Remote infarct of the body of the corpus callosum. MRI Remote lacunar infarcts of the left thalamus and posterior limb right internal capsule.  Hypertension Home meds:  amlodipine 5mg , metoprolol 25mg  Stable BP goal normotensive.   Hyperlipidemia Home meds:  Lipitor 40mg   LDL 78, goal < 70 Continue statin at current dose, was just placed on this during recent admission.  Continue statin at discharge  Other Stroke Risk Factors Obesity, Body mass index is 30.27 kg/m., BMI >/= 30 associated with increased stroke risk, recommend weight loss, diet and exercise as appropriate   Hospital day # 0  Patient is OK for discharge from neurology standpoint, with recommendations as above.  Follow-up with outpatient neurology in 8 weeks.    Pt seen by Neuro NP/APP and later by MD. Note/plan to be edited by MD as needed.    Lynnae January, DNP, AGACNP-BC [facility] Please use AMION for  contact information & EPIC for messaging.  ATTENDING NOTE: I reviewed above note and agree with the assessment and plan. Pt was seen and examined.   Daughters and Engineer, technical sales on board.  Patient lying in bed, awake alert, orientated to place and people but not orientated to age or time.  No focal neurologic deficit seen on exam, specifically no hemianopia.  Moving all extremities.  Patient had recent right PCA scattered stroke with right PCA occlusion, current MRI showed small new infarct at the same right PCA distribution however right PCA now patent with mild to moderate stenosis.  Concerning for cardioembolic source of right PCA.  Recommend 3-day CardioNet monitoring as outpatient.  However, continue home regimen of DAPT for 3 months and then aspirin alone.  Continue home statin.  PT and OT recommend no follow-up.  For detailed assessment and plan, please refer to above/below as I have made changes wherever appropriate.   Neurology will sign off. Please call with questions. Pt will follow up with stroke clinic NP at [facility] in about 4 weeks. Thanks for the consult.   Marvel Plan, MD PhD Stroke Neurology 04/01/2023 9:34 PM  I spent additional inpatient 30 minutes face-to-face time with the patient, more than 50% of which was spent in counseling and coordination of care, reviewing test results, images and medication, and discussing the diagnosis, treatment plan and potential prognosis. This patient's care requiresreview of multiple databases, neurological assessment, discussion with family, other specialists and medical decision making of high complexity. I had long discussion with daughter at bedside, updated pt current condition, treatment plan and potential prognosis, and answered all the questions.  THey expressed understanding and appreciation.        To contact Stroke Continuity provider, please refer to [email or web address]. After hours, contact General Neurology

## 2023-04-01 NOTE — Evaluation (Signed)
Physical Therapy Evaluation & Discharge Patient Details Name: Todd [name] MRN: [id number] DOB: May 06, 1930 Today's Date: 04/01/2023  History of Present Illness  Pt is 89 y.o. male who was admitted on 03/31/23 after 5 days of leg and arm weakness, facial numbness, chest pain, SOB, and has tested positive for influenza. 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 Of note, pt was recently admitted on 03/24/23 with a PCA stroke and has tested positive for pneumonia. PMHx: CAP, HTN, cardiomegaly, HLD, and DOE.   Clinical Impression  Pt presents with condition above. In-person interpreter, Raquel, utilized throughout the duration of this session. Pt is typically independent without DME at baseline, living with his daughter in a 1-level house with 3 STE. Currently, pt is functioning near his baseline, demonstrating WFL, intact, and symmetrical bil lower extremity strength, sensation, and coordination. He was able to ambulate without LOB, even while facing dynamic gait challenges, without UE support or physical assistance. He is a little shaky today, but that may be from not being OOB much since admission. Pt will likely progress well. All education completed and questions answered. As pt is near his baseline and has good support at d/c, do not feel pt needs further acute PT or post-acute PT services. Will defer further mobility in halls to nursing staff and mobility specialists. PT will sign off.      If plan is discharge home, recommend the following: Assistance with cooking/housework;Direct supervision/assist for financial management;Direct supervision/assist for medications management;Assist for transportation;Supervision due to cognitive status;Help with stairs or ramp for entrance   Can travel by private vehicle        Equipment Recommendations None recommended by PT  Recommendations for Other Services       Functional Status Assessment Patient has had a recent decline in their  functional status and demonstrates the ability to make significant improvements in function in a reasonable and predictable amount of time.     Precautions / Restrictions Precautions Precautions: Fall Precaution Comments: questionable mild L vision deficits Restrictions Weight Bearing Restrictions Per Provider Order: No      Mobility  Bed Mobility Overal bed mobility: Needs Assistance Bed Mobility: Sit to Supine, Supine to Sit     Supine to sit: Min assist, HOB elevated Sit to supine: Supervision, HOB elevated   General bed mobility comments: MinA to ascend trunk due to noted increased effort and difficulty for pt with pushing his trunk up to sit R edge of stretcher. Supervision for safety with return to supine    Transfers Overall transfer level: Needs assistance Equipment used: None Transfers: Sit to/from Stand Sit to Stand: Contact guard assist           General transfer comment: CGA for safety transferring to stand from edge of stretcher, mild shakiness noted, but no LOB    Ambulation/Gait Ambulation/Gait assistance: Contact guard assist, Supervision Gait Distance (Feet): 160 Feet Assistive device: None Gait Pattern/deviations: Step-through pattern, Decreased stride length Gait velocity: decreased Gait velocity interpretation: 1.31 - 2.62 ft/sec, indicative of limited community ambulator   General Gait Details: Pt ambulates with mild shakiness/bouncing to his steps but no overt LOB, even when cued to change directions, speeds, and head positions. Pt does slow his gait with challenges intermittently though. CGA progressing to supervision for safety only.  Stairs            Wheelchair Mobility     Tilt Bed    Modified Rankin (Stroke Patients Only) Modified Rankin (Stroke Patients Only) Pre-Morbid Rankin  Score: No symptoms Modified Rankin: Slight disability     Balance Overall balance assessment: Mild deficits observed, not formally tested                                            Pertinent Vitals/Pain Pain Assessment Pain Assessment: Faces Faces Pain Scale: No hurt Pain Intervention(s): Monitored during session    Home Living Family/patient expects to be discharged to:: Private residence Living Arrangements: Children (daughter) Available Help at Discharge: Available 24 hours/day;Family Type of Home: House Home Access: Stairs to enter Entrance Stairs-Rails: Lawyer of Steps: 3   Home Layout: One level Home Equipment: None Additional Comments: Plans to visit [place] 2[place]21    Prior Function Prior Level of Function : Independent/Modified Independent             Mobility Comments: No AD       Extremity/Trunk Assessment   Upper Extremity Assessment Upper Extremity Assessment: Defer to OT evaluation    Lower Extremity Assessment Lower Extremity Assessment: Overall WFL for tasks assessed (MMT scores of 5 grossly bil; coordination grossly intact bil; sensation intact bil)    Cervical / Trunk Assessment Cervical / Trunk Assessment: Normal  Communication   Communication Communication: Other (comment) (Spanish speaking; in-person interpreter, Raquel, utilized throughout session) Following commands: Follows one step commands consistently Cueing Techniques: Gestural cues;Verbal cues;Tactile cues;Visual cues  Cognition Arousal: Alert Behavior During Therapy: WFL for tasks assessed/performed Overall Cognitive Status: Difficult to assess                                 General Comments: In-person interpreter, Raquel, utilized throughout session. Pt often responding "yes" or "ok" to English directions without waiting for interpretation, even though he really cannot understanding English. Had difficulty following cues with MMT and with visual field testing, but followed simple cues for generalized mobility and daily tasks well. Unsure if baseline or not.         General Comments General comments (skin integrity, edema, etc.): educated pt and daughter on signs/symptoms of strokes; educated them for pt to mobilize with nursing and mobility techs >/= 3x/day in hallway to ensure he maintains his mobility and strength while here, they verbalized understanding    Exercises     Assessment/Plan    PT Assessment Patient does not need any further PT services  PT Problem List Decreased balance;Decreased mobility;Decreased cognition       PT Treatment Interventions Functional mobility training;Balance training;Patient/family education;Gait training;Therapeutic activities;Stair training;DME instruction;Therapeutic exercise;Neuromuscular re-education;Cognitive remediation    PT Goals (Current goals can be found in the Care Plan section)  Acute Rehab PT Goals Patient Stated Goal: home PT Goal Formulation: With patient/family Time For Goal Achievement: 04/02/23 Potential to Achieve Goals: Good    Frequency Min 1X/week     Co-evaluation PT/OT/SLP Co-Evaluation/Treatment: Yes Reason for Co-Treatment: For patient/therapist safety;To address functional/ADL transfers;Other (comment) (utilizing in-person interpreter) PT goals addressed during session: Mobility/safety with mobility;Balance         AM-PAC PT "6 Clicks" Mobility  Outcome Measure Help needed turning from your back to your side while in a flat bed without using bedrails?: A Little Help needed moving from lying on your back to sitting on the side of a flat bed without using bedrails?: A Little Help needed moving to and from  a bed to a chair (including a wheelchair)?: A Little Help needed standing up from a chair using your arms (e.g., wheelchair or bedside chair)?: A Little Help needed to walk in hospital room?: A Little Help needed climbing 3-5 steps with a railing? : A Little 6 Click Score: 18    End of Session Equipment Utilized During Treatment: Gait belt Activity Tolerance: Patient  tolerated treatment well Patient left: in bed;with call bell/phone within reach;with family/visitor present   PT Visit Diagnosis: Unsteadiness on feet (R26.81)    Time: 1610-9604 PT Time Calculation (min) (ACUTE ONLY): 29 min   Charges:   PT Evaluation $PT Eval Low Complexity: 1 Low   PT General Charges $$ ACUTE PT VISIT: 1 Visit         Virgil Benedict, PT, DPT Acute Rehabilitation Services  Office: [phone]   Bettina Gavia 04/01/2023, 12:33 PM

## 2023-04-01 NOTE — Discharge Summary (Incomplete)
Name: Todd [name] MRN: [id number] DOB: Jul 10, 1930 89 y.o. PCP: [facility]  Date of Admission: 03/31/2023 10:11 AM Date of Discharge: 04/01/2023 Attending Physician: Dr. Lafonda Mosses  Discharge Diagnosis: Principal Problem:   Acute CVA (cerebrovascular accident) [facility]) Active Problems:   Numbness of face   Influenza   Influenza A    Discharge Medications: Allergies as of 04/01/2023   No Known Allergies      Medication List     TAKE these medications    amLODipine 5 MG tablet Commonly known as: NORVASC Tome 1 tableta (5 mg en total) por va oral diariamente. (Take 1 tablet (5 mg total) by mouth daily.)   aspirin EC 81 MG tablet Take 1 tablet (81 mg total) by mouth daily. Swallow whole.   atorvastatin 40 MG tablet Commonly known as: LIPITOR Tome 1 tableta (40 mg en total) por va oral diariamente. (Take 1 tablet (40 mg total) by mouth daily.)   clopidogrel 75 MG tablet Commonly known as: PLAVIX Tome 1 tableta (75 mg en total) por va oral diariamente. (Take 1 tablet (75 mg total) by mouth daily.)   metoprolol tartrate 25 MG tablet Commonly known as: LOPRESSOR Take 25 mg by mouth 2 (two) times daily.   ondansetron 4 MG disintegrating tablet Commonly known as: ZOFRAN-ODT Take 4 mg by mouth every 8 (eight) hours as needed.   oseltamivir 30 MG capsule Commonly known as: TAMIFLU Take 1 capsule (30 mg total) by mouth 2 (two) times daily for 4 days. Start taking on: April 02, 2023        Disposition and follow-up:   [name] was discharged from [facility] in [place] condition.  At the hospital follow up visit please address:  1.  Follow-up:  a.  Right PCA stroke. - -   b.  Acute influenza A.  c.  Hypokalemia/hyponatremia.  d.  2.  Labs / imaging needed at time of follow-up: BMP  3.  Pending labs/ test needing follow-up:   4.  Medication Changes Abx -   End Date:  Follow-up  Appointments:   Hospital Course by problem list: Todd [name] is a 89 y.o. male with a hx of PCI infarct, hypertension presented with generalized weakness and  cough and admitted for infection with influenza virus and with brain imaging showing extension of recent L occipital stroke.  Active Problems:   Numbness of face   # Acute infarct of right occipital lobe. # Prior PCA stroke Acute onset of generalized left sided weakness, and tingling/numbness.  No new focal neurologic deficit on exams.  CT angio concerning for lacunar infarct in the left and right globus pallidus, MRI of the brain showed slight extension of the previously seen left occipital lobe stroke.  HbA1c 5.8, normal EKG. I spoke to cardiology, prior event monitor was not ordered.  Patient will receive event monitor at least in the next 5-7 days.  Will continue on aspirin and clopidogrel.  Neurology also recommended restarting blood pressure meds amlodipine and metoprolol.  - Clopidogrel for 3 months, continue with aspirin indefinitely.   - 30-day event cardiac monitoring  # Acute influenza virus infection # cough # Weakness Pt with  fatigue, generalized weakness, and dry cough.   Had a fever to 101.5, no leukocytosis, clear lungs bilaterally, positive for the flu.  Received treatment with Tamiflu, will need to complete 4 more days of Tamiflu 30 mg BID  - Tamiflu 30 mg BID for 4 days. - Cough suppressant  with benzonatate   #HTN Hypertensive on presentation, BP 161/83.  We held amlodipine and metoprolol on presentation.  Restarted prior to discharge, per neurologies recommendations.  -Continue amlodipine 5 mg  -Continue metoprolol 25 mg BID.   #Elevated creatinine # Hypokalemia SCr mildly elevated, stable on discharge.   #HLD Continued with atorvastatin 40 mg.  #Thyroid nodules Incidental finding of multiple hypoenhancing nodules in the thyroid, recommending non-emergent ultrasound to further evaluate.   -Follow-up outpatient.   Discharge Subjective: Patient evaluated at bedside this AM.  Discharge Exam:   BP (!) 156/86 (BP Location: Left Arm)   Pulse 80   Temp 98.5 F (36.9 C) (Oral)   Resp (!) 22   Ht 5' (1.524 m)   Wt 70.3 kg   SpO2 98%   BMI 30.27 kg/m  Constitutional: well-appearing *** sitting in ***, in no acute distress HENT: normocephalic atraumatic, mucous membranes moist Eyes: conjunctiva non-erythematous Neck: supple Cardiovascular: regular rate and rhythm, no m/r/g Pulmonary/Chest: normal work of breathing on room air, lungs clear to auscultation bilaterally Abdominal: soft, non-tender, non-distended MSK: normal bulk and tone Neurological: alert & oriented x 3, 5/5 strength in bilateral upper and lower extremities, normal gait Skin: warm and dry Psych: ***   Pertinent Labs, Studies, and Procedures:     Latest Ref Rng & Units 04/01/2023    5:22 AM 03/31/2023   10:53 AM 03/31/2023   10:25 AM  CBC  WBC 4.0 - 10.5 K/uL 5.3   6.1   Hemoglobin 13.0 - 17.0 g/dL 16.1  09.6  04.5   Hematocrit 39.0 - 52.0 % 41.3  44.0  44.1   Platelets 150 - 400 K/uL 121   126        Latest Ref Rng & Units 04/01/2023    5:22 AM 03/31/2023   10:53 AM 03/31/2023   10:25 AM  CMP  Glucose 70 - 99 mg/dL 90  409  811   BUN 8 - 23 mg/dL 10  13  13    Creatinine 0.61 - 1.24 mg/dL 9.14  7.82  9.56   Sodium 135 - 145 mmol/L 132  138  135   Potassium 3.5 - 5.1 mmol/L 3.0  3.5  3.3   Chloride 98 - 111 mmol/L 98  100  102   CO2 22 - 32 mmol/L 23   22   Calcium 8.9 - 10.3 mg/dL 7.7   8.3   Total Protein 6.5 - 8.1 g/dL   7.7   Total Bilirubin 0.0 - 1.2 mg/dL   0.6   Alkaline Phos 38 - 126 U/L   91   AST 15 - 41 U/L   33   ALT 0 - 44 U/L   30     MR Brain Wo Contrast (neuro protocol) Result Date: 03/31/2023 CLINICAL DATA:  Five-day history of bilateral leg and arm numbness. Facial numbness. Chest pain and shortness of breath. Recent diagnosis of posterior circulation infarct. EXAM: MRI  HEAD WITHOUT CONTRAST TECHNIQUE: Multiplanar, multiecho pulse sequences of the brain and surrounding structures were obtained without intravenous contrast. COMPARISON:  CT angio head and neck 03/31/2023 FINDINGS: Brain: A linear area of restricted diffusion is present in the posterior right occipital lobe. Foci of susceptibility are present in the cerebellum bilaterally, right greater than left. These may represent cavernomas. Remote lacunar infarcts are present in the left thalamus and posterior limb right internal capsule. Dilated perivascular spaces are present throughout the basal ganglia. No other acute infarct or hemorrhage is present. The  ventricles are of normal size. No significant extraaxial fluid collection is present. Brainstem and cerebellum are otherwise within normal limits. The internal auditory canals are within normal limits. Midline structures are within normal limits. Vascular: Flow is present in the major intracranial arteries. Skull and upper cervical spine: Degenerative changes are present the upper cervical spine, most evident at C5-6. Sinuses/Orbits: Mild mucosal thickening is present the maxillary sinuses and anterior ethmoid air cells bilaterally. A right lens replacement is present. The globes and orbits are otherwise within normal limits. IMPRESSION: 1. Linear area of restricted diffusion in the posterior right occipital lobe compatible with acute infarct. 2. Remote lacunar infarcts of the left thalamus and posterior limb right internal capsule. 3. Foci of susceptibility in the cerebellum bilaterally, right greater than left. These may represent cavernomas. 4. Degenerative changes of the upper cervical spine, most evident at C5-6. Electronically Signed   By: Marin Roberts M.D.   On: 03/31/2023 14:42   CT Angio Head Neck W WO CM Result Date: 03/31/2023 CLINICAL DATA:  Facial numbness and weakness. Personal history of posterior infarct. EXAM: CT ANGIOGRAPHY HEAD AND NECK WITH AND  WITHOUT CONTRAST TECHNIQUE: Multidetector CT imaging of the head and neck was performed using the standard protocol during bolus administration of intravenous contrast. Multiplanar CT image reconstructions and MIPs were obtained to evaluate the vascular anatomy. Carotid stenosis measurements (when applicable) are obtained utilizing NASCET criteria, using the distal internal carotid diameter as the denominator. RADIATION DOSE REDUCTION: This exam was performed according to the departmental dose-optimization program which includes automated exposure control, adjustment of the mA and/or kV according to patient size and/or use of iterative reconstruction technique. CONTRAST:  OMNIPAQUE IOHEXOL 350 MG/ML SOLN COMPARISON:  None Available. FINDINGS: CT HEAD FINDINGS Brain: Age indeterminate lacunar infarcts are present in the left and right globus pallidus. Diffuse periventricular and subcortical white matter hypoattenuation is present in the anterior frontal lobes bilaterally. A remote infarct is present in the body of the corpus callosum. The deep brain nuclei are otherwise within normal limits. The ventricles are of normal size. No significant extraaxial fluid collection is present. Punctate calcification is present in the lateral left parietal lobe on image 21 of series 3. No acute hemorrhage present. No mass lesion is present. The brainstem and cerebellum are within normal limits. Midline structures are within normal limits. Vascular: No hyperdense vessel or unexpected calcification. Skull: Calvarium is intact. No focal lytic or blastic lesions are present. No significant extracranial soft tissue lesion is present. Sinuses/Orbits: Mild mucosal thickening is present in the inferior right maxillary sinus. No fluid levels are present. Bilateral lens replacements are noted. Globes and orbits are otherwise unremarkable. Review of the MIP images confirms the above findings CTA NECK FINDINGS Aortic arch:  Atherosclerotic changes are present at the aortic arch. No significant stenosis is present at the great vessel origins. A 3 vessel arch configuration is present. No dissection is present. Right carotid system: The right common carotid artery is within normal limits. The right carotid bifurcation is unremarkable. The cervical right ICA is normal. Left carotid system: The left common carotid artery is within normal limits. Minimal calcifications are present at the bifurcation without significant stenosis. The cervical left ICA is normal. Vertebral arteries: The left vertebral artery is dominant vessel. Both vertebral arteries originate from the subclavian arteries without significant stenosis. No significant stenosis is present in either vertebral artery in the neck. Skeleton: Multilevel degenerative changes are present in the cervical spine. Ankylosis is present across  the disc space and left posterior elements at C2-3 and C5-6. Slight anterolisthesis is present at C4-5 and C7-T1. Straightening of the normal cervical lordosis is present. No focal osseous lesions are present. Other neck: Soft tissues the neck are otherwise unremarkable. Salivary glands are within normal limits. No significant adenopathy is present. No focal mucosal or submucosal lesions are present. A 2.4 cm nodule is present in the inferior right thyroid lobe. Upper chest: Scattered ground-glass attenuation is present in the right upper lobe. The lungs are otherwise clear. No pleural effusion or pneumothorax is present. Review of the MIP images confirms the above findings CTA HEAD FINDINGS Anterior circulation: The internal carotid artery is within normal limits from the skull base to the ICA termini. The A1 and M1 segments are normal. The anterior communicating is patent. The MCA bifurcations are normal bilaterally. Moderate stenosis is present in the posterior right M2 division. Mild narrowing is present slightly more distally within an anterior  left M3 branch. No obstruction is present. No aneurysm is present. The ACA branch vessels are normal bilaterally. Posterior circulation: The left vertebral artery is dominant vessel. The PICA origins are visualized and normal. The vertebrobasilar junction and basilar artery normal. The superior cerebellar arteries are patent. Both posterior cerebral arteries originate from basilar tip. Moderate stenosis is present in the right P2 segment. The PCA branch vessels are otherwise normal. Venous sinuses: The dural sinuses are patent. The straight sinus and deep cerebral veins are intact. Cortical veins are within normal limits. No significant vascular malformation is evident. Anatomic variants: None Review of the MIP images confirms the above findings IMPRESSION: 1. Age indeterminate lacunar infarcts of the left and right globus pallidus. MRI may be useful to assess acuity if these correlate clinically. 2. Diffuse periventricular and subcortical white matter hypoattenuation in the anterior frontal lobes bilaterally is nonspecific, but likely reflects the sequela of chronic microvascular ischemia. 3. Remote infarct of the body of the corpus callosum. 4. No acute intracranial abnormality. 5. Moderate stenosis of the posterior right M2 division. 6. Mild narrowing slightly more distally within an anterior left M3 branch. 7. Moderate stenosis of the right P2 segment. 8. No other significant proximal stenosis, aneurysm, or branch vessel occlusion within the Circle of Willis. 9. Minimal atherosclerotic changes at the left carotid bifurcation without significant stenosis. 10. 2.4 cm nodule in the inferior right thyroid lobe. Recommend non-emergent thyroid ultrasound if clinically warranted given patient age. Reference: J Am Coll Radiol. 2015 Feb;12(2): 143-50 11. Multilevel degenerative changes in the cervical spine. 12.  Aortic Atherosclerosis (ICD10-I70.0). Electronically Signed   By: Marin Roberts M.D.   On: 03/31/2023  12:53   CT Angio Chest/Abd/Pel for Dissection W and/or W/WO Result Date: 03/31/2023 CLINICAL DATA:  89 year old male with chest and abdominal pain. Lower extremity weakness. EXAM: CT ANGIOGRAPHY CHEST, ABDOMEN AND PELVIS TECHNIQUE: Non-contrast CT of the chest was initially obtained. Multidetector CT imaging through the chest, abdomen and pelvis was performed using the standard protocol during bolus administration of intravenous contrast. Multiplanar reconstructed images and MIPs were obtained and reviewed to evaluate the vascular anatomy. RADIATION DOSE REDUCTION: This exam was performed according to the departmental dose-optimization program which includes automated exposure control, adjustment of the mA and/or kV according to patient size and/or use of iterative reconstruction technique. CONTRAST:  OMNIPAQUE IOHEXOL 350 MG/ML SOLN COMPARISON:  CTA neck today reported separately. CTA chest abdomen and pelvis 08/24/2022. CT Abdomen and Pelvis 09/18/2012. FINDINGS: CTA CHEST FINDINGS Cardiovascular: Calcified coronary artery atherosclerosis  and aortic atherosclerosis redemonstrated. Stable heart size, within normal limits. Small simple fluid density pericardial effusion does not appear significantly changed from last year, significance is doubtful. Following contrast extensive noncalcified aortic atherosclerosis redemonstrated. Cardiac pulsation, negative for thoracic aortic aneurysm or dissection. Little contrast in the pulmonary arterial system but the main pulmonary arteries appear to remain patent. Mediastinum/Nodes: Negative for mediastinal mass or lymphadenopathy. Lungs/Pleura: Similar lung volumes to last year. Major airways are patent. No convincing pleural effusion. Mild mosaic attenuation with no consolidation or convincing pulmonary inflammation. Musculoskeletal: Mild respiratory motion limits some rib detail. Lower cervical and upper thoracic vertebral endplate degeneration again noted. No acute  osseous abnormality identified. Review of the MIP images confirms the above findings. CTA ABDOMEN AND PELVIS FINDINGS VASCULAR Aortoiliac calcified atherosclerosis. Normal caliber abdominal aorta. Negative for aortic dissection. Asymmetric chronic atherosclerosis perhaps with a small chronic short segment dissection at the right Common iliac artery bifurcation appears unchanged. Major arterial structures in the abdomen and pelvis remain patent. No acute arterial finding. Review of the MIP images confirms the above findings. NON-VASCULAR Hepatobiliary: Polycystic liver disease appears stable from last year, most pronounced in the left lobe (no follow-up imaging recommended). Motion artifact limits detail of the gallbladder which appears grossly normal. No perihepatic free fluid. Pancreas: Negative. Spleen: Negative. Adrenals/Urinary Tract: Adrenal glands are stable and within normal limits. Nonobstructed kidneys enhance symmetrically. Small benign appearing renal cysts again noted (no follow-up imaging recommended). Decompressed ureters. Unremarkable bladder. Stomach/Bowel: Diverticulosis redemonstrated at the distal descending and proximal sigmoid colon with no active inflammation. Below average volume of retained stool in the large bowel. No dilated loops. Normal appendix on coronal image 87 of series 13. No free air, free fluid, or convincing bowel inflammation. Stomach and duodenum are decompressed. Lymphatic: No lymphadenopathy identified. Reproductive: Negative. Other: No pelvis free fluid. Musculoskeletal: Generally mild for age lumbar spine degeneration, chronically advanced at the lumbosacral junction. No acute osseous abnormality identified. Review of the MIP images confirms the above findings. IMPRESSION: Aortic Atherosclerosis (ICD10-I70.0), Negative for aortic Aneurysm or dissection. And no acute or inflammatory process identified in the chest, abdomen, or pelvis. Normal appendix. Electronically Signed    By: Odessa Fleming M.D.   On: 03/31/2023 12:38     Discharge Instructions: Discharge Instructions     Call MD for:  persistant dizziness or light-headedness   Complete by: As directed    Diet - low sodium heart healthy   Complete by: As directed    Discharge instructions   Complete by: As directed    - Follow up with PCP.       Signed: Laretta Bolster, MD 04/01/2023, 3:29 PM   Pager: [phone]

## 2023-04-01 NOTE — Evaluation (Cosign Needed)
SLP Cancellation Note  Patient Details Name: Todd [name] MRN: [id number] DOB: October 02, 1930   Cancelled evaluation:  Pt passed [facility] swallow screen immediately prior to session, so pt does not need bedside swallow eval at this time.       Rowe Robert 04/01/2023, 11:03 AM

## 2023-04-01 NOTE — Discharge Instructions (Addendum)
FOLLOW-UP INSTRUCTIONS:  Thank you for allowing [place] to be part of your care. You were hospitalized due to the feeling sick with the flu and a new stroke which is an extension of the old stroke.  Please follow up with the following providers: A. PCP, [facility], [address] / [place] [address], [phone]    Please note these changes made to your medications:    A. Medications to start: - Oseltamivir 30 mg twice a day for 4 days.   - Aspirin 81 mg  - Clopidogrel 75 mg  - Metoprolol 25 mg BID - Amlodipine 5 mg  -  Atorvastatin 40 mg.   C. Medications to Hold - None   Please make sure to return to the hospital if you have worsening one-sided weakness, new fevers and worsening shortness of breath.  Please call our clinic if you have any questions or concerns, we may be able to help and keep you from a long and expensive emergency room wait. Our clinic and after hours phone number is [phone], the best time to call is Monday through Friday 9 am to 4 pm but there is always someone available 24/7 if you have an emergency. If you need medication refills please notify your pharmacy one week in advance and they will send [place] a request.

## 2023-04-01 NOTE — ED Notes (Signed)
Utilizing a spanish interpreter, this RN reviewed discharge instructions with patient and family. All verbalized understanding and denied any further questions. PT well appearing upon discharge and denies pain.

## 2023-04-01 NOTE — Progress Notes (Addendum)
Ordered 30 day Event Monitor for further evaluation of stroke at the request of Neurology. Messaged office to help arrange this.  Corrin Parker, PA-C 04/01/2023 1:42 PM

## 2023-04-01 NOTE — ED Notes (Signed)
TOC pharmacy delivered to pt and pt endorses a safe ride home. Pt wheeled to exit by family.

## 2023-04-01 NOTE — Discharge Planning (Signed)
RNCM contacted [facility] for ETA of discharge medications.  TOCP gave 15 minutes as ETA ([id number]).

## 2023-04-01 NOTE — Evaluation (Signed)
Occupational Therapy Evaluation Patient Details Name: Todd [name] MRN: [id number] DOB: 1930-10-16 Today's Date: 04/01/2023   History of Present Illness Pt is 89 y.o. male who was admitted on 03/31/23 after 5 days of leg and arm weakness, facial numbness, chest pain, SOB, and has tested positive for influenza. 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 Of note, pt was recently admitted on 03/24/23 with a PCA stroke and has tested positive for pneumonia. PMHx: CAP, HTN, cardiomegaly, HLD, and DOE.   Clinical Impression   Patient admitted for the diagnosis above.  PTA he lives at home with one of his daughters, and has any needed assist.  Currently he is needing occasional CGA, but is supervision for in room mobility/toileting and ADL completion.  No significant OT needs are identified in the acute setting.  Recommend patient return home with previous level of support from family.         If plan is discharge home, recommend the following: A little help with bathing/dressing/bathroom;A little help with walking and/or transfers;Assistance with cooking/housework;Direct supervision/assist for financial management;Assist for transportation;Help with stairs or ramp for entrance;Direct supervision/assist for medications management    Functional Status Assessment  Patient has not had a recent decline in their functional status  Equipment Recommendations  None recommended by OT    Recommendations for Other Services       Precautions / Restrictions Precautions Precautions: Fall Precaution Comments: questionable mild L vision deficits Restrictions Weight Bearing Restrictions Per Provider Order: No      Mobility Bed Mobility Overal bed mobility: Needs Assistance Bed Mobility: Supine to Sit, Sit to Supine     Supine to sit: Min assist, HOB elevated Sit to supine: Supervision, HOB elevated        Transfers Overall transfer level: Needs assistance Equipment used:  None Transfers: Sit to/from Stand Sit to Stand: Contact guard assist, Supervision                  Balance Overall balance assessment: Mild deficits observed, not formally tested                                         ADL either performed or assessed with clinical judgement   ADL                                         General ADL Comments: Generalized supervision     Vision   Vision Assessment?: No apparent visual deficits Additional Comments: ? of L visual field cut via Neurologist.  Patient did attend to L visual field when prompted, he did not run into any objects while walking with PT     Perception Perception: Not tested       Praxis Praxis: Not tested       Pertinent Vitals/Pain Pain Assessment Pain Assessment: No/denies pain Pain Intervention(s): Monitored during session     Extremity/Trunk Assessment Upper Extremity Assessment Upper Extremity Assessment: Overall WFL for tasks assessed   Lower Extremity Assessment Lower Extremity Assessment: Defer to PT evaluation   Cervical / Trunk Assessment Cervical / Trunk Assessment: Normal   Communication Communication Communication: Other (comment) Following commands: Follows one step commands consistently Cueing Techniques: Verbal cues   Cognition Arousal: Alert Behavior During Therapy: WFL for tasks assessed/performed Overall Cognitive Status: Difficult  to assess                                       General Comments  PT educated pt and daughter on signs/symptoms of strokes; educated them for pt to mobilize with nursing and mobility techs >/= 3x/day in hallway to ensure he maintains his mobility and strength while here, they verbalized understanding    Exercises     Shoulder Instructions      Home Living Family/patient expects to be discharged to:: Private residence Living Arrangements: Children Available Help at Discharge: Available 24  hours/day;Family Type of Home: House Home Access: Stairs to enter Secretary/administrator of Steps: 3 Entrance Stairs-Rails: Left;Right Home Layout: One level     Bathroom Shower/Tub: Arts development officer: Standard     Home Equipment: None   Additional Comments: Plans to visit [place] 2[place]21      Prior Functioning/Environment Prior Level of Function : Independent/Modified Independent             Mobility Comments: No AD ADLs Comments: Ind with ADL        OT Problem List: Impaired balance (sitting and/or standing);Decreased safety awareness      OT Treatment/Interventions: Self-care/ADL training;Patient/family education;Balance training;Neuromuscular education;Therapeutic activities;DME and/or AE instruction    OT Goals(Current goals can be found in the care plan section) Acute Rehab OT Goals Patient Stated Goal: Go home OT Goal Formulation: With patient Time For Goal Achievement: 04/08/23 Potential to Achieve Goals: Good  OT Frequency:      Co-evaluation PT/OT/SLP Co-Evaluation/Treatment: Yes Reason for Co-Treatment: For patient/therapist safety;To address functional/ADL transfers;Other (comment) PT goals addressed during session: Mobility/safety with mobility;Balance OT goals addressed during session: ADL's and self-care      AM-PAC OT "6 Clicks" Daily Activity     Outcome Measure Help from another person eating meals?: None Help from another person taking care of personal grooming?: None Help from another person toileting, which includes using toliet, bedpan, or urinal?: A Little Help from another person bathing (including washing, rinsing, drying)?: A Little Help from another person to put on and taking off regular upper body clothing?: None Help from another person to put on and taking off regular lower body clothing?: A Little 6 Click Score: 21   End of Session Equipment Utilized During Treatment: Gait belt Nurse Communication:  Mobility status  Activity Tolerance: Patient tolerated treatment well Patient left: in bed;with call bell/phone within reach;with family/visitor present  OT Visit Diagnosis: Unsteadiness on feet (R26.81)                Time: 1140-1201 OT Time Calculation (min): 21 min Charges:  OT General Charges $OT Visit: 1 Visit OT Evaluation $OT Eval Moderate Complexity: 1 Mod  04/01/2023  RP, OTR/L  Acute Rehabilitation Services  Office:  [phone]   Suzanna Obey 04/01/2023, 12:52 PM

## 2023-04-10 DIAGNOSIS — I639 Cerebral infarction, unspecified: Secondary | ICD-10-CM

## 2023-05-04 ENCOUNTER — Ambulatory Visit: Payer: Medicaid Other | Attending: Internal Medicine | Admitting: Occupational Therapy

## 2023-05-04 ENCOUNTER — Other Ambulatory Visit: Payer: Self-pay

## 2023-05-04 DIAGNOSIS — M6281 Muscle weakness (generalized): Secondary | ICD-10-CM | POA: Diagnosis present

## 2023-05-04 DIAGNOSIS — I639 Cerebral infarction, unspecified: Secondary | ICD-10-CM | POA: Diagnosis not present

## 2023-05-04 DIAGNOSIS — R278 Other lack of coordination: Secondary | ICD-10-CM | POA: Insufficient documentation

## 2023-05-04 DIAGNOSIS — R29818 Other symptoms and signs involving the nervous system: Secondary | ICD-10-CM | POA: Insufficient documentation

## 2023-05-04 DIAGNOSIS — R208 Other disturbances of skin sensation: Secondary | ICD-10-CM | POA: Diagnosis present

## 2023-05-04 NOTE — Therapy (Signed)
 OUTPATIENT OCCUPATIONAL THERAPY NEURO EVALUATION  Patient Name: Todd [name] MRN: [id number] DOB:1930/10/29, 89 y.o., male Today's Date: 05/04/2023  PCP: [facility] REFERRING PROVIDER: Mercie Eon, MD  END OF SESSION:  OT End of Session - 05/04/23 0736     Visit Number 1    Number of Visits 1    Authorization Type [employer] 2025    OT Start Time [id number]    OT Stop Time 0840    OT Time Calculation (min) 45 min    Equipment Utilized During Treatment Testing Material    Activity Tolerance Patient tolerated treatment well    Behavior During Therapy WFL for tasks assessed/performed             Past Medical History:  Diagnosis Date   GERD (gastroesophageal reflux disease)    Hyperlipidemia    Hypertension    No past surgical history on file. Patient Active Problem List   Diagnosis Date Noted   Influenza A 04/01/2023   Numbness of face 03/31/2023   Influenza 03/31/2023   Acute CVA (cerebrovascular accident) (HCC) 03/31/2023   Essential hypertension 03/24/2023   Cerebrovascular accident (CVA) (HCC) 03/23/2023   Acute right-sided weakness 03/22/2023   Dyspnea on exertion 10/20/2021   Hyperlipidemia    Essential hypertension 09/22/2012   Cardiomegaly 09/21/2012   CAP (community acquired pneumonia) 09/19/2012   Abdominal pain, acute, generalized 09/19/2012   Nausea & vomiting 09/19/2012   Hypertension associated with diabetes (HCC) 09/19/2012   Dehydration, mild 09/19/2012    ONSET DATE: 03/23/2023  REFERRING DIAG: I63.9 (ICD-10-CM) - Cerebrovascular accident (CVA), unspecified mechanism   THERAPY DIAG:  Muscle weakness (generalized)  Other lack of coordination  Other disturbances of skin sensation  Other symptoms and signs involving the nervous system  Rationale for Evaluation and Treatment: Rehabilitation  SUBJECTIVE:   SUBJECTIVE STATEMENT: Pt and son report pt has 13 children and 20+ grandchildren who help with taking care  of pt including driving him around etc.   Pt and son reports no ongoing concerns since discharge from the hospital.  Pt has resumed his previous activities and is taking care of himself even stepping into the tub to shower.  They have talked about making some renovations  to the bathroom.    Pt accompanied by: self and family member - son [place] Initial [employer] interpreter Sharlet Salina  [phone]  In person interpreter: Clent Demark  PERTINENT HISTORY:  PMHx: HTN, hyperlipidemia, thyroid nodules, Influenza A (03/31/23)  Patient presented to the ED 03/22/23 after development of acute right sided weakness per MD DC summary but was left sided weakness per pt/son. Symptoms had resolved by time of assessment in the ED. 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 Neurology recommended 30 day cardiac monitoring after discharge as well as DAPT for 3 months followed by aspirin 81mg  indefinitely.   PRECAUTIONS: Fall  WEIGHT BEARING RESTRICTIONS: No  PAIN:  Are you having pain? No  FALLS: Has patient fallen in last 6 months? No  LIVING ENVIRONMENT: Lives with: lives with their family, lives with their spouse, and lives with their daughter Living Arrangements: Children (daughter) Available Help at Discharge: Available 24 hours/day;Family Type of Home: House Home Access: Stairs to enter Secretary/administrator of Steps: 3 Entrance Stairs-Rails: Left;Right Home Layout: One level Bathroom Shower/Tub: Forensic scientist: Standard Home Equipment: None  PLOF: Pt lived with his daughter and was Independent with basic ADLs  PATIENT GOALS: Not sure -  just following up due to MD recommendation  OBJECTIVE:  Note: Objective measures were completed at Evaluation unless otherwise noted.  HAND DOMINANCE: Right  ADLs: Overall ADLs: Independent pt/son report that he is not getting any help with self care at this  time Transfers/ambulation related to ADLs: Ind with extra time Eating: Ind Grooming: Ind UB Dressing: Ind LB Dressing: Ind Toileting: Mod Ind with rails Bathing: Ind Tub Shower transfers: Ind with extra time and no rails Equipment: none  IADLs: Shopping: Family Light housekeeping: Family Meal Prep: Family Community mobility: Ind without AE Medication management: Family Financial management: Family Handwriting: NT  MOBILITY STATUS: Independent - He ambulates cautiously and slowly without any use of cane or walker device.   POSTURE COMMENTS:  rounded shoulders and forward head Sitting balance: WNL  ACTIVITY TOLERANCE: Activity tolerance: Good  FUNCTIONAL OUTCOME MEASURES: NA due to to complaints of UE pain, weakness or limitations  UPPER EXTREMITY ROM:    Slight end range shoulder limitations but generally WNL   UPPER EXTREMITY MMT:    4-/5  HAND FUNCTION: Grip strength: Right: 43, 47, 49  lbs; Left: 50, 48, 55 lbs Average Right 46.3 lbs; Left: 51 lbs  COORDINATION: Box and Blocks:  Right 30 blocks, Left 25 blocks  SENSATION: WFL  EDEMA: NA  MUSCLE TONE: WNL  COGNITION: Overall cognitive status: Within functional limits for tasks assessed  VISION: Subjective report: Harder to see far away Baseline vision: No visual deficits Visual history:  NA  VISION ASSESSMENT: Not tested  Patient has difficulty with following activities due to following visual impairments: seeing far away  OBSERVATIONS: Pt ambulates cautiously and slowly with no AE and no loss of balance. The pt appears well kept, clean shaven and has cowboy hat/boots donned.                                                                                                                            TREATMENT DATE:    Education provided re: staying active, safe and considerations for AE ie) seat for shower.   PATIENT EDUCATION: Education details: OT role and DC instructions Person educated:  Patient and Child(ren) Education method: Explanation and Verbal cues Education comprehension: verbalized understanding  HOME EXERCISE PROGRAM: NA  ASSESSMENT:  CLINICAL IMPRESSION: Patient is a 89 y.o. male who was seen today for occupational therapy evaluation following CVA last month. Hx includes HTN, hyperlipidemia, thyroid nodules, and recent Influenza A (03/31/23). Patient currently presents at baseline level of function demonstrating slight deficits in strength and coordination which appear to be baseline function for him. Pt would not substantially benefit from skilled OT services in the outpatient setting at this time as pt and his son report that he has returned to PLOF.    PERFORMANCE DEFICITS: in functional skills including coordination, dexterity, tone, ROM, strength, Fine motor control, Gross motor control, mobility, balance, decreased knowledge of use of DME, and UE functional use, cognitive skills including safety awareness, and psychosocial  skills including coping strategies and environmental adaptation.   IMPAIRMENTS: are not limiting patient from daily routine with self care etc  CO-MORBIDITIES: may have co-morbidities  that affects occupational performance. Patient will benefit from skilled OT to address above impairments and improve overall function.  MODIFICATION OR ASSISTANCE TO COMPLETE EVALUATION: No modification of tasks or assist necessary to complete an evaluation.  OT OCCUPATIONAL PROFILE AND HISTORY: Problem focused assessment: Including review of records relating to presenting problem.  CLINICAL DECISION MAKING: LOW - limited treatment options, no task modification necessary  REHAB POTENTIAL: Excellent  EVALUATION COMPLEXITY: Low    PLAN:  OT FREQUENCY: one time visit  PLANNED INTERVENTIONS: patient/family education  RECOMMENDED OTHER SERVICES: NA  CONSULTED AND AGREED WITH PLAN OF CARE: Patient and family member/caregiver   Victorino Sparrow,  OT 05/04/2023, 8:41 AM

## 2023-05-09 ENCOUNTER — Other Ambulatory Visit: Payer: Self-pay

## 2023-05-09 ENCOUNTER — Encounter (HOSPITAL_COMMUNITY): Payer: Self-pay

## 2023-05-09 ENCOUNTER — Emergency Department (HOSPITAL_COMMUNITY)
Admission: EM | Admit: 2023-05-09 | Discharge: 2023-05-09 | Disposition: A | Discharge status: Home / Self Care | Attending: Emergency Medicine | Admitting: Emergency Medicine

## 2023-05-09 DIAGNOSIS — Z7901 Long term (current) use of anticoagulants: Secondary | ICD-10-CM | POA: Diagnosis not present

## 2023-05-09 DIAGNOSIS — M79643 Pain in unspecified hand: Secondary | ICD-10-CM | POA: Diagnosis present

## 2023-05-09 DIAGNOSIS — S60229A Contusion of unspecified hand, initial encounter: Secondary | ICD-10-CM | POA: Insufficient documentation

## 2023-05-09 DIAGNOSIS — X58XXXA Exposure to other specified factors, initial encounter: Secondary | ICD-10-CM | POA: Diagnosis not present

## 2023-05-09 DIAGNOSIS — Z7982 Long term (current) use of aspirin: Secondary | ICD-10-CM | POA: Diagnosis not present

## 2023-05-09 NOTE — ED Provider Notes (Signed)
 [place] EMERGENCY DEPARTMENT AT [facility] Provider Note   CSN: [id number] Arrival date & time: 05/09/23  1653     History  Chief Complaint  Patient presents with   Hand Pain    Todd [name] is a 89 y.o. male.  HPI    89 year old male comes in with chief complaint of hand pain. Patient was recently diagnosed with stroke, started on Plavix and aspirin.  He states that around 1 PM, he started noticing swelling to his left hand.  Over time, the swelling worsened.  Now the swelling has stabilized.  He does not have any associated numbness, tingling, pain.  There is some discomfort when he tries to move his hand.  He denies any trauma.  No previous history of similar symptoms.  Home Medications Prior to Admission medications   Medication Sig Start Date End Date Taking? Authorizing Provider  amLODipine (NORVASC) 5 MG tablet Take 1 tablet (5 mg total) by mouth daily. 03/24/23   Monna Fam, MD  aspirin EC 81 MG tablet Take 1 tablet (81 mg total) by mouth daily. Swallow whole. 03/24/23   Monna Fam, MD  atorvastatin (LIPITOR) 40 MG tablet Take 1 tablet (40 mg total) by mouth daily. 03/24/23   Monna Fam, MD  clopidogrel (PLAVIX) 75 MG tablet Take 1 tablet (75 mg total) by mouth daily. 03/25/23   Monna Fam, MD  metoprolol tartrate (LOPRESSOR) 25 MG tablet Take 25 mg by mouth 2 (two) times daily. 02/23/23   [provider]  ondansetron (ZOFRAN-ODT) 4 MG disintegrating tablet Take 4 mg by mouth every 8 (eight) hours as needed. 03/30/23   [provider]      Allergies    Patient has no known allergies.    Review of Systems   Review of Systems  All other systems reviewed and are negative.   Physical Exam Updated Vital Signs BP (!) 175/82 (BP Location: Left Arm)   Pulse 90   Temp 98.3 F (36.8 C)   Resp 16   Ht 5' (1.524 m)   Wt 89.8 kg   SpO2 93%   BMI 38.67 kg/m  Physical Exam Vitals and nursing note reviewed.  Constitutional:       Appearance: He is well-developed.  HENT:     Head: Atraumatic.  Cardiovascular:     Rate and Rhythm: Normal rate.  Pulmonary:     Effort: Pulmonary effort is normal.  Musculoskeletal:        General: Swelling present. No deformity or signs of injury.     Cervical back: Neck supple.  Skin:    General: Skin is warm.     Findings: Bruising and erythema present.  Neurological:     Mental Status: He is alert and oriented to person, place, and time.        ED Results / Procedures / Treatments   Labs (all labs ordered are listed, but only abnormal results are displayed) Labs Reviewed - No data to display  EKG None  Radiology No results found.  Procedures Procedures    Medications Ordered in ED Medications - No data to display  ED Course/ Medical Decision Making/ A&P                                 Medical Decision Making  Translation service was utilized for this visit.  89 year old male with history of stroke on Plavix and aspirin, comes in with  chief complaint of sudden onset swelling to his right hand.  There is fluctuance and edema over the dorsum of the right hand, with some bruising.  Patient's hand exam is otherwise reassuring.  Neurovascularly patient is intact.  History provided by patient's daughter, was also at the bedside. No specific trauma.  Differential diagnosis for him includes hematoma to the hand, cellulitis of the hand, abscess of the hand, cyst to the hand.  For now, suspicion is high that this is hematoma given the spontaneously of the symptoms and clear evidence of bruising.   Plan is to apply compressive Ace wrap along with ice therapy today followed by heat therapy starting tomorrow. Final Clinical Impression(s) / ED Diagnoses Final diagnoses:  Hematoma of hand    Rx / DC Orders ED Discharge Orders     None         Derwood Kaplan, MD 05/09/23 1920

## 2023-05-09 NOTE — ED Triage Notes (Addendum)
 Pt arrived POV from home c/o right hand swelling and bruising that they noticed today. Pt denies any injuries or pain.

## 2023-05-10 ENCOUNTER — Telehealth: Payer: Self-pay | Admitting: *Deleted

## 2023-05-10 NOTE — Telephone Encounter (Signed)
 Informed Ms. Todd [name], her fathers cardiac event monitor was completed as of 05/09/2023.  They can remove the monitor and ship it back to [employer].   I will mail a reminder letter about his appointment with Azalee Course on 05/24/2023, 08:50 AM.

## 2023-05-10 NOTE — Telephone Encounter (Signed)
-----   Message from Roe Rutherford Duke sent at 05/10/2023 11:04 AM EST ----- Regarding: FW: follow up  Burnett Harry can you help with this?  Thanks Angie ----- Message ----- From: Derwood Kaplan, MD Sent: 05/09/2023   7:15 PM EST To: Roe Rutherford Duke, PA Subject: follow up                                      Nicky Pugh,  I saw this patient in the ER for a complaint unrelated to his cardiac monitoring, but family indicated that they had run out of the stickers for the device that was given for cardiac monitoring.  They were also not sure how long he needed to continue to monitor -they are thinking 3 months at this time.  Will you be kind enough and just contact the family/patient or have the device company region.  Thank you for your help,  Ankit N

## 2023-05-13 ENCOUNTER — Ambulatory Visit: Attending: Student

## 2023-05-13 DIAGNOSIS — I639 Cerebral infarction, unspecified: Secondary | ICD-10-CM

## 2023-05-24 ENCOUNTER — Ambulatory Visit: Payer: Self-pay | Attending: Physician Assistant | Admitting: Physician Assistant

## 2023-05-24 ENCOUNTER — Encounter: Payer: Self-pay | Admitting: Physician Assistant

## 2023-05-24 VITALS — BP 128/76 | HR 60 | Ht 63.0 in | Wt 158.4 lb

## 2023-05-24 DIAGNOSIS — R079 Chest pain, unspecified: Secondary | ICD-10-CM | POA: Diagnosis not present

## 2023-05-24 DIAGNOSIS — I639 Cerebral infarction, unspecified: Secondary | ICD-10-CM

## 2023-05-24 DIAGNOSIS — R002 Palpitations: Secondary | ICD-10-CM | POA: Diagnosis not present

## 2023-05-24 NOTE — Patient Instructions (Signed)
 Medication Instructions:  NO CHANGES *If you need a refill on your cardiac medications before your next appointment, please call your pharmacy*   Lab Work: NO LABS If you have labs (blood work) drawn today and your tests are completely normal, you will receive your results only by: MyChart Message (if you have MyChart) OR A paper copy in the mail If you have any lab test that is abnormal or we need to change your treatment, we will call you to review the results.   Testing/Procedures: NO TESTING   Follow-Up: At [facility], you and your health needs are our priority.  As part of our continuing mission to provide you with exceptional heart care, we have created designated Provider Care Teams.  These Care Teams include your primary Cardiologist (physician) and Advanced Practice Providers (APPs -  Physician Assistants and Nurse Practitioners) who all work together to provide you with the care you need, when you need it.  We recommend signing up for the patient portal called "MyChart".  Sign up information is provided on this After Visit Summary.  MyChart is used to connect with patients for Virtual Visits (Telemedicine).  Patients are able to view lab/test results, encounter notes, upcoming appointments, etc.  Non-urgent messages can be sent to your provider as well.   To learn more about what you can do with MyChart, go to [email or web address].    Your next appointment:   4 month(s)  Provider:   Nanetta Batty, MD   Other Instructions

## 2023-05-24 NOTE — Progress Notes (Signed)
 Cardiology Office Note:  .   Date:  05/24/2023  ID:  Todd [name], DOB 10-Mar-1930, MRN [id number] PCP: Default, Provider, MD  [facility] Providers Cardiologist:  Nanetta Batty, MD     History of Present Illness: .   Todd [name] is a 89 y.o. male with past medical history of HTN, HLD, former tobacco use.  [place] is from [place] He was first seen by Dr. Allyson Sabal on 10/20/2021 for dyspnea on exertion.  He complained of dyspnea on exertion since 2020 for unclear reason.  He declined invasive evaluation at that time.  Echocardiogram 10/2021 indicated EF of 60 to 65%, LV with normal function, no RWMA, mild LV hypertrophy.  There was aortic valve sclerosis with no evidence of stenosis.  CT of the chest abdomen pelvis in June 2024 showed no evidence of aortic aneurysm or dissection, diverticular change without diverticulitis.  He was diagnosed with COVID in August 2024.  He was seen in the office here August 2024 was chest discomfort and a palpitation.  He was unable to tell me the characteristic of his palpitation.  I recommended addition of aspirin metoprolol tartrate.  Unfortunately, he presented in January 2025 with acute vision change and right-sided weakness and was admitted with right PCA stroke.  He was discharged on aspirin and Plavix.  Neurology recommended a 30-day heart monitor after discharge and DAPT for 3 months followed by aspirin 81 mg indefinitely.  Echocardiogram obtained on 03/23/2023 showed EF 65 to 70%, no regional wall motion abnormality, grade 1 DD, normal RV, small pericardial effusion anterior to the right ventricle, no significant valve issue.  Unfortunately he was unable to wear the heart monitor as he went back to [place]  He was seen in the ED on 05/09/2023 with swelling of the left hand.  He denies any trauma.  Physical exam was reassuring however suspected hematoma. He had a 30 day heart monitor that showed NSR without afib or atrial flutter.  Patient presents  today for follow-up.  He denies any chest pain or shortness of breath.  Palpitation improved after I started him on metoprolol last year.  Right hand hematoma has completely resolved.  He has no lower extremity edema, thumbnail PND. He continued to have right lower extremity weakness, right upper extremity strength is equal bilaterally.  Family requested a letter requesting some assistance from his daughter from [place] to help stay with him here to take care of him as his other daughter [place] has to go to work.  I did generate a letter for him, however I was not sure if it would have any major impact on his other daughter coming to [place].  If medical necessity need to be evaluated, then I would recommend they discuss with neurology service in that matter.  Otherwise, he can follow-up with Dr. Allyson Sabal in 4 months.  Interview conducted with Spanish Translator Ms Barbette Merino of [facility] translation service  ROS:   He denies chest pain, palpitations, dyspnea, pnd, orthopnea, n, v, dizziness, syncope, edema, weight gain, or early satiety. All other systems reviewed and are otherwise negative except as noted above.   He does have RLE weakness   Studies Reviewed: .        Cardiac Studies & Procedures   ______________________________________________________________________________________________     ECHOCARDIOGRAM  ECHOCARDIOGRAM COMPLETE BUBBLE STUDY 03/23/2023  Narrative ECHOCARDIOGRAM REPORT    Patient Name:   Todd [name] Date of Exam: 03/23/2023 Medical Rec #:  [id number]  Height:       62.0 in Accession #:    [id number]        Weight:       159.6 lb Date of Birth:  1930-08-10         BSA:          1.737 m Patient Age:    89 years          BP:           168/86 mmHg Patient Gender: M                 HR:           79 bpm. Exam Location:  Inpatient  Procedure: 2D Echo, Cardiac Doppler and Color Doppler  Indications:    TIA 435.9/ G45.9  History:         Patient has no prior history of Echocardiogram examinations.  Sonographer:    Lucendia Herrlich RCS Referring Phys: [id number] JULIE MACHEN  IMPRESSIONS   1. Left ventricular ejection fraction, by estimation, is 65 to 70%. The left ventricle has normal function. The left ventricle has no regional wall motion abnormalities. There is mild left ventricular hypertrophy. Left ventricular diastolic parameters are consistent with Grade I diastolic dysfunction (impaired relaxation). 2. Right ventricular systolic function is normal. The right ventricular size is normal. 3. A small pericardial effusion is present. The pericardial effusion is anterior to the right ventricle. 4. The mitral valve is normal in structure. No evidence of mitral valve regurgitation. No evidence of mitral stenosis. 5. The aortic valve was not well visualized. Aortic valve regurgitation is not visualized. Aortic valve sclerosis is present, with no evidence of aortic valve stenosis.  Comparison(s): No prior Echocardiogram.  FINDINGS Left Ventricle: Left ventricular ejection fraction, by estimation, is 65 to 70%. The left ventricle has normal function. The left ventricle has no regional wall motion abnormalities. The left ventricular internal cavity size was normal in size. There is mild left ventricular hypertrophy. Left ventricular diastolic parameters are consistent with Grade I diastolic dysfunction (impaired relaxation).  Right Ventricle: The right ventricular size is normal. No increase in right ventricular wall thickness. Right ventricular systolic function is normal.  Left Atrium: Left atrial size was not well visualized.  Right Atrium: Right atrial size was not well visualized.  Pericardium: A small pericardial effusion is present. The pericardial effusion is anterior to the right ventricle.  Mitral Valve: The mitral valve is normal in structure. No evidence of mitral valve regurgitation. No evidence of mitral valve  stenosis.  Tricuspid Valve: The tricuspid valve is normal in structure. Tricuspid valve regurgitation is not demonstrated. No evidence of tricuspid stenosis.  Aortic Valve: The aortic valve was not well visualized. Aortic valve regurgitation is not visualized. Aortic valve sclerosis is present, with no evidence of aortic valve stenosis. Aortic valve peak gradient measures 5.3 mmHg.  Pulmonic Valve: The pulmonic valve was normal in structure. Pulmonic valve regurgitation is not visualized. No evidence of pulmonic stenosis.  Aorta: The aortic root and ascending aorta are structurally normal, with no evidence of dilitation.  IAS/Shunts: The atrial septum is grossly normal.   LEFT VENTRICLE PLAX 2D LVOT diam:     2.00 cm   Diastology LV SV:         51        LV e' medial:    6.96 cm/s LV SV Index:   29        LV E/e' medial:  8.7 LVOT  Area:     3.14 cm  LV e' lateral:   7.51 cm/s LV E/e' lateral: 8.0   RIGHT VENTRICLE RV S prime:     17.30 cm/s TAPSE (M-mode): 1.8 cm  LEFT ATRIUM             Index LA Vol (A2C):   25.5 ml 14.68 ml/m LA Vol (A4C):   44.8 ml 25.79 ml/m LA Biplane Vol: 35.3 ml 20.32 ml/m AORTIC VALVE AV Area (Vmax): 2.46 cm AV Vmax:        115.00 cm/s AV Peak Grad:   5.3 mmHg LVOT Vmax:      90.15 cm/s LVOT Vmean:     60.250 cm/s LVOT VTI:       0.162 m  AORTA Ao Root diam: 3.50 cm Ao Asc diam:  3.10 cm  MITRAL VALVE MV Area (PHT): 3.37 cm    SHUNTS MV Decel Time: 225 msec    Systemic VTI:  0.16 m MV E velocity: 60.30 cm/s  Systemic Diam: 2.00 cm MV A velocity: 92.00 cm/s MV E/A ratio:  0.66  Riley Lam MD Electronically signed by Riley Lam MD Signature Date/Time: 03/23/2023/12:14:19 PM    Final    MONITORS  CARDIAC EVENT MONITOR 05/13/2023  Narrative SR w/o arrhythmia       ______________________________________________________________________________________________      Risk Assessment/Calculations:              Physical Exam:   VS:  BP 128/76 (BP Location: Left Arm, Patient Position: Sitting, Cuff Size: Normal)   Pulse 60   Ht 5\' 3"  (1.6 m)   Wt 158 lb 6.4 oz (71.8 kg)   SpO2 97%   BMI 28.06 kg/m    Wt Readings from Last 3 Encounters:  05/24/23 158 lb 6.4 oz (71.8 kg)  05/09/23 198 lb (89.8 kg)  03/31/23 155 lb (70.3 kg)    GEN: Well nourished, well developed in no acute distress NECK: No JVD; No carotid bruits CARDIAC: RRR, no murmurs, rubs, gallops RESPIRATORY:  Clear to auscultation without rales, wheezing or rhonchi  ABDOMEN: Soft, non-tender, non-distended EXTREMITIES:  No edema; No deformity   ASSESSMENT AND PLAN: .    Chest pain: seen previously for chest tightness during COVID infection in 2024, symptom resolved after placed on metoprolol and ASA  Palpitation: seen previously for palpitation during COVID in 2024, symptom resolved after placed on ASA and metoprolol  Recent CVA: on ASA and plavix. Per family, patient has a plane ticket to fly to [place] next week. Recommend follow up with neurology service. Patient is trying to request help from his family member from [place] to help care for him        Dispo: follow up with Dr. Allyson Sabal in 6 month  Signed, Azalee Course, [place]

## 2023-06-01 ENCOUNTER — Ambulatory Visit: Payer: BLUE CROSS/BLUE SHIELD | Admitting: Internal Medicine

## 2023-06-04 ENCOUNTER — Ambulatory Visit
Admission: EM | Admit: 2023-06-04 | Discharge: 2023-06-04 | Disposition: A | Discharge status: Home / Self Care | Attending: Family Medicine | Admitting: Family Medicine

## 2023-06-04 ENCOUNTER — Ambulatory Visit (INDEPENDENT_AMBULATORY_CARE_PROVIDER_SITE_OTHER)

## 2023-06-04 DIAGNOSIS — M79674 Pain in right toe(s): Secondary | ICD-10-CM

## 2023-06-04 NOTE — ED Provider Notes (Signed)
 [facility]    CSN: [id number] Arrival date & time: 06/04/23  1545      History   Chief Complaint Chief Complaint  Patient presents with   Foot Pain    HPI Todd [name] is a 89 y.o. male.    Foot Pain  Here for pain in his right fifth toe.  No trauma or fall.  He just notes that it is rubbed by his shoe and it bothers him.  No history of diabetes.  No drainage from the area.  No fever or chills  NKDA  Past Medical History:  Diagnosis Date   GERD (gastroesophageal reflux disease)    Hyperlipidemia    Hypertension     Patient Active Problem List   Diagnosis Date Noted   Influenza A 04/01/2023   Numbness of face 03/31/2023   Influenza 03/31/2023   Acute CVA (cerebrovascular accident) (HCC) 03/31/2023   Essential hypertension 03/24/2023   Cerebrovascular accident (CVA) (HCC) 03/23/2023   Acute right-sided weakness 03/22/2023   Dyspnea on exertion 10/20/2021   Hyperlipidemia    Essential hypertension 09/22/2012   Cardiomegaly 09/21/2012   CAP (community acquired pneumonia) 09/19/2012   Abdominal pain, acute, generalized 09/19/2012   Nausea & vomiting 09/19/2012   Hypertension associated with diabetes (HCC) 09/19/2012   Dehydration, mild 09/19/2012    History reviewed. No pertinent surgical history.     Home Medications    Prior to Admission medications   Medication Sig Start Date End Date Taking? Authorizing Provider  amLODipine (NORVASC) 10 MG tablet Take 1 tablet by mouth daily. 04/05/23 04/04/24 Yes [provider]  aspirin EC 81 MG tablet Take 1 tablet (81 mg total) by mouth daily. Swallow whole. 03/24/23  Yes Monna Fam, MD  atorvastatin (LIPITOR) 10 MG tablet Take 1 tablet by mouth daily. 05/28/23  Yes [provider]  clopidogrel (PLAVIX) 75 MG tablet Take 1 tablet (75 mg total) by mouth daily. 03/25/23  Yes Monna Fam, MD  metoprolol tartrate (LOPRESSOR) 25 MG tablet Take 25 mg by mouth 2 (two) times daily.  02/23/23  Yes [provider]  atorvastatin (LIPITOR) 40 MG tablet Take 1 tablet (40 mg total) by mouth daily. 03/24/23   Monna Fam, MD  ondansetron (ZOFRAN-ODT) 4 MG disintegrating tablet Take 4 mg by mouth every 8 (eight) hours as needed. Patient not taking: Reported on 05/24/2023 03/30/23   [provider]    Family History Family History  Problem Relation Age of Onset   Arthritis Mother    Heart disease Brother    Heart disease Brother    Heart disease Brother    Heart disease Brother    Heart disease Brother    Hyperlipidemia Son     Social History Social History   Tobacco Use   Smoking status: Never   Smokeless tobacco: Never  Vaping Use   Vaping status: Never Used  Substance Use Topics   Alcohol use: Not Currently   Drug use: Never     Allergies   Patient has no known allergies.   Review of Systems Review of Systems   Physical Exam Triage Vital Signs ED Triage Vitals  Encounter Vitals Group     BP 06/04/23 1628 (!) 154/74     Systolic BP Percentile --      Diastolic BP Percentile --      Pulse Rate 06/04/23 1628 88     Resp 06/04/23 1628 18     Temp 06/04/23 1628 98.7 F (37.1 C)  Temp Source 06/04/23 1628 Oral     SpO2 06/04/23 1628 96 %     Weight 06/04/23 1624 158 lb 4.6 oz (71.8 kg)     Height 06/04/23 1624 5\' 3"  (1.6 m)     Head Circumference --      Peak Flow --      Pain Score 06/04/23 1623 10     Pain Loc --      Pain Education --      Exclude from Growth Chart --    No data found.  Updated Vital Signs BP (!) 154/74 (BP Location: Left Arm)   Pulse 88   Temp 98.7 F (37.1 C) (Oral)   Resp 18   Ht 5\' 3"  (1.6 m)   Wt 71.8 kg   SpO2 96%   BMI 28.04 kg/m   Visual Acuity Right Eye Distance:   Left Eye Distance:   Bilateral Distance:    Right Eye Near:   Left Eye Near:    Bilateral Near:     Physical Exam Vitals reviewed.  Constitutional:      General: He is not in acute distress.    Appearance: He  is not toxic-appearing.  Musculoskeletal:     Comments: There is no swelling of the right fifth toe.  There is a little bit of edema of the dorsum of the right foot.  No erythema.  The toenail on the right fifth toe is thickened and yellow.  Skin:    Coloration: Skin is not pale.  Neurological:     General: No focal deficit present.     Mental Status: He is alert and oriented to person, place, and time.  Psychiatric:        Behavior: Behavior normal.      UC Treatments / Results  Labs (all labs ordered are listed, but only abnormal results are displayed) Labs Reviewed - No data to display  EKG   Radiology No results found.  Procedures Procedures (including critical care time)  Medications Ordered in UC Medications - No data to display  Initial Impression / Assessment and Plan / UC Course  I have reviewed the triage vital signs and the nursing notes.  Pertinent labs & imaging results that were available during my care of the patient were reviewed by me and considered in my medical decision making (see chart for details).     Visit conducted in Spanish By my review there is degenerative change in his toe.  There is 1 area on his distal phalanx that could be an avulsion fracture but I think it is most likely degenerative change and spurring.  They are advised of radiology overread.  Postop shoe is provided and I have asked him to take Tylenol as needed.  They are given contact information for podiatry.  Final Clinical Impressions(s) / UC Diagnoses   Final diagnoses:  Toe pain, right   Discharge Instructions   None    ED Prescriptions   None    PDMP not reviewed this encounter.   Zenia Resides, MD 06/04/23 [phone]

## 2023-06-04 NOTE — ED Triage Notes (Signed)
 Due to language barrier, an interpreter was present during the history-taking and subsequent discussion (and for part of the physical exam) with this patient. Todd [name]. Number: [id number]  Here with Daughter (interpreting for dad). "He has a spot on his right foot that has been bothering him a lot". No injury. No fever.

## 2023-06-04 NOTE — Discharge Instructions (Addendum)
 By my review there is most likely mainly arthritis noted on the x-ray.  There is a questionable area on the in the bone. The radiologist will also read your x-ray, and if their interpretation differs significantly from mine, and the management of your condition would change, we will call you. (Segn mi revisin, lo ms probable es que la radiografa presente principalmente artritis. Hay una zona sospechosa en el hueso. El radilogo tambin interpretar su radiografa y, si su interpretacin difiere significativamente de la ma y el tratamiento de su afeccin cambia, le llamaremos.)  Take Tylenol as needed for the pain. (Tome Tylenol segn sea necesario para el dolor.)  Try wearing the postop shoe and see if that helps alleviate your pain.(Pruebe usar el zapato postoperatorio para ver si le ayuda a Engineer, materials.)

## 2023-06-16 ENCOUNTER — Ambulatory Visit: Admitting: Podiatry

## 2023-06-29 ENCOUNTER — Telehealth: Payer: Self-pay | Admitting: Physician Assistant

## 2023-06-29 DIAGNOSIS — Z79899 Other long term (current) drug therapy: Secondary | ICD-10-CM

## 2023-06-29 MED ORDER — FUROSEMIDE 20 MG PO TABS: 20.0000 mg | ORAL_TABLET | Freq: Every day | ORAL | 0 refills | Status: DC

## 2023-06-29 NOTE — Telephone Encounter (Signed)
 Called and spoke to daughter.  Patient has already taken first dose of Lasix  20 mg today.  She understands that it is okay to keep 5/13 appointment with Dr. Katheryne Pane.  They will plan on going to [facility] on 07/05/23 to have BMET drawn.  Advised her to call us  back if symptoms worsen.

## 2023-06-29 NOTE — Telephone Encounter (Signed)
 Pt c/o swelling/edema: STAT if pt has developed SOB within 24 hours  If swelling, where is the swelling located? Hands and feet   How much weight have you gained and in what time span? Not sure   Have you gained 2 pounds in a day or 5 pounds in a week? Not sure   Do you have a log of your daily weights (if so, list)? Did not log   Are you currently taking a fluid pill? No   Are you currently SOB? No but has some fatigue   Have you traveled recently in a car or plane for an extended period of time?

## 2023-06-29 NOTE — Telephone Encounter (Addendum)
 All information given/received in [place].  Returned daughter's phone call.  Patient has developed, over the last 3 weeks, swelling to BLE, with some light purple discoloration to both feet.  Swelling goes up to the knees.  They have tried elevating legs and warm compresses with no improvement in swelling.  Pt was on an airplane around 05/25/2023, traveled out of the country, then returned about two weeks later (by airplane).  The only other symptom is pt becomes tired more easily and has to stop to catch his breath.  The daughter states that pt denies feeling bad, but family is worried about him.    Spoke to Dr. Audery Blazing (DOD) and he prescribed Lasix  20 mg q day for three days, then BMET after and Dr. Katheryne Pane to follow up with results.    Daughter is not sure how long the pt has been on Amlodipine , unsure if this medication was increased/started after last hospitalization.  Also, she reports patient's BP's have been: on Sunday, 146/90, on Monday morning 187/97, on Monday evening 167/89.   Daughter would like pt to be seen sooner than 07/20/23.

## 2023-07-01 ENCOUNTER — Ambulatory Visit (INDEPENDENT_AMBULATORY_CARE_PROVIDER_SITE_OTHER)

## 2023-07-01 ENCOUNTER — Encounter: Payer: Self-pay | Admitting: Podiatry

## 2023-07-01 ENCOUNTER — Ambulatory Visit (INDEPENDENT_AMBULATORY_CARE_PROVIDER_SITE_OTHER): Admitting: Podiatry

## 2023-07-01 VITALS — Ht 63.0 in | Wt 158.3 lb

## 2023-07-01 DIAGNOSIS — M7751 Other enthesopathy of right foot: Secondary | ICD-10-CM

## 2023-07-01 DIAGNOSIS — M2041 Other hammer toe(s) (acquired), right foot: Secondary | ICD-10-CM | POA: Diagnosis not present

## 2023-07-01 DIAGNOSIS — L84 Corns and callosities: Secondary | ICD-10-CM

## 2023-07-01 DIAGNOSIS — M778 Other enthesopathies, not elsewhere classified: Secondary | ICD-10-CM

## 2023-07-01 NOTE — Patient Instructions (Signed)
 VISIT SUMMARY:  Today, you were seen for pain extending from your hip down to your right foot, with a focus on your right fifth toe. You have a corn on this toe, which is causing significant discomfort. We also discussed your history of heart attack and seizure, as well as the swelling in your feet.  YOUR PLAN:  -CORN ON RIGHT FIFTH TOE: A corn is a thickened area of skin that forms due to pressure, often from footwear. We performed a safe removal of the thickened skin in the office. You should use a pad on your toe to reduce pressure and wear well-fitting shoes to prevent it from coming back. Keep the skin moisturized with a good lotion. We also provided educational materials in Spanish about corns and calluses.  -PEDAL EDEMA: Pedal edema is swelling in the feet, which can be due to fluid retention. Your primary care provider will continue to evaluate and manage this condition.  -MYOCARDIAL INFARCTION AND SEIZURE: Your recent heart attack and seizure are important factors in your overall health. These conditions influenced our decision to avoid surgery for the bone spur under your toe.  INSTRUCTIONS:  Please follow up with your primary care provider for further evaluation and management of your pedal edema. Continue to monitor your heart health and seizure condition as advised by your healthcare providers.

## 2023-07-01 NOTE — Progress Notes (Signed)
 Subjective:  Patient ID: Todd [name], male    DOB: 04/30/1930,  MRN: [id number]  Chief Complaint  Patient presents with   Foot Pain    Pt is here for bilateral foot pain states right foot is the worst been going on for 1 month, when he wakes up feet are very swollen and painful.    Discussed the use of AI scribe software for clinical note transcription with the patient, who gave verbal consent to proceed.  History of Present Illness Todd [name] is a 89 year old male who presents with pain from the hip down to the foot, focusing on the right fifth toe.  He experiences pain extending from the hip down to the foot, with a particular focus on the right fifth toe. The pain is exacerbated by pressure on the toe, where there is a corn, a thickening of the skin. This corn is associated with inflammation that travels upwards from the toe.  He has a history of heart attack and seizure, which raises concerns about his overall health. He experiences swelling in his feet, making walking difficult and painful. He has been informed that this might be due to fluid retention.  No back issues, pinched nerves, or disc problems.      Objective:    Physical Exam VASCULAR: DP and PT pulses palpable. Foot is warm and well-perfused. Capillary fill time is brisk. Good circulation in feet, blood flow to toes is good. DERMATOLOGIC: Normal skin turgor, texture, and temperature. No open lesions, rashes, or ulcerations. NEUROLOGIC: Normal sensation to light touch and pressure. No paresthesias on examination. ORTHOPEDIC: Smooth pain-free range of motion of all examined joints. No ecchymosis or bruising.  EXTREMITIES: +1 pedal edema with thin, shiny skin and atrophic hair growth.  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 Debridement of callus on right fifth lateral toe.   No images are attached to the encounter.    Results Procedure: Debridement of  callus on right fifth lateral toe Description: Sharp debridement with a number 15 blade was completed to a tolerable level to reduce the corn and callus. Informed Consent: Counseling about the risks, benefits, and alternatives to the procedure was provided. It was explained that the procedure should not cause significant pain or complications and that it could be performed in the office immediately. The patient was informed about the good circulation in the feet and the importance of wearing properly fitting shoes to prevent the corn from returning.  RADIOLOGY Foot X-ray: Bone spur under the right fifth toe (07/01/2023)   Assessment:   1. Corn of toe   2. Hammertoe of right foot      Plan:  Patient was evaluated and treated and all questions answered.  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 An x-ray revealed a bone spur under the toe contributing to corn formation. Surgical intervention is not recommended due to potential risks, especially considering his recent myocardial infarction and seizure. Sharp debridement was performed safely in the office. Good circulation in the feet supports debridement. - Perform sharp debridement as a courtesy today of the corn using a number 312 blade to a tolerable level. - Provide a pad to wear on the toe to alleviate pressure. - Advise on the use of well-fitting shoes to prevent recurrence. - Recommend keeping the skin moisturized with a good lotion. -  Provide educational materials on corns and calluses in Spanish.  Pedal edema Presence of plus one pedal edema with thin, shiny skin and atrophic hair growth. The edema may be related to fluid retention, as suggested by the primary care provider. Further evaluation and management are to be continued by the primary care provider.  Myocardial infarction and  seizure Recent myocardial infarction and seizure raise concerns about his overall health status, influencing the decision to avoid surgical intervention for the bone spur.      No follow-ups on file.

## 2023-07-05 LAB — BASIC METABOLIC PANEL WITH GFR
BUN/Creatinine Ratio: 9 — ABNORMAL LOW (ref 10–24)
BUN: 9 mg/dL — ABNORMAL LOW (ref 10–36)
CO2: 24 mmol/L (ref 20–29)
Calcium: 8.7 mg/dL (ref 8.6–10.2)
Chloride: 101 mmol/L (ref 96–106)
Creatinine, Ser: 0.96 mg/dL (ref 0.76–1.27)
Glucose: 138 mg/dL — ABNORMAL HIGH (ref 70–99)
Potassium: 4 mmol/L (ref 3.5–5.2)
Sodium: 139 mmol/L (ref 134–144)
eGFR: 75 mL/min/{1.73_m2} (ref >59)

## 2023-07-05 NOTE — Progress Notes (Unsigned)
 Cardiology Office Note:    Date:  07/06/2023  ID:  Todd [name], DOB October 25, 1930, MRN [id number] PCP: Default, Provider, MD  [facility] Providers Cardiologist:  Lauro Portal, MD       Patient Profile:      Hypertension Hyperlipidemia S/p CVA in 03/2023 TTE 03/23/2023: EF 65-70, no RWMA, mild LVH, GR 1 DD, normal RVSF, small pericardial effusion Monitor 04/2023: NSR, HR 91 Aortic atherosclerosis        Discussed the use of AI scribe software for clinical note transcription with the patient, who gave verbal consent to proceed.  History of Present Illness Todd [name] is a 89 y.o. male who returns for follow-up of palpitations, Hx of CVA.  He was last seen in clinic by Ervin Heath, PA-C 05/2023 after admission to the hospital in January with right PCA stroke.  Neurology discharged him on dual antiplatelet therapy for 3 months followed by aspirin  alone.  30-day monitor was obtained and demonstrated normal sinus rhythm.  Echocardiogram demonstrated normal EF.  He is here with his daughter. He is seen with the help of the interpreter line. He has experienced lower extremity edema over the last month. The swelling is mostly in the afternoons and evenings, with minimal improvement in the mornings. He was prescribed Lasix  for three days last week, but did not notice any improvement in the swelling. He experiences shortness of breath with exertion and occasional chest discomfort. The chest pain occurs randomly, sometimes at rest, and lasts between two to six minutes. He sometimes notes exertional chest pain. No associated arm pain, jaw pain, or cough. He reports frequent urination, which has been ongoing for a while. His daughter notes that he has difficulty moving three fingers on his right hand, which has worsened since his stroke. He has not seen neurology yet. No recent weight gain or significant changes in his condition since returning from a trip to [place] Of note, his  legs were swollen before he went to [place]   ROS-See HPI    Studies Reviewed:   EKG Interpretation Date/Time:  Tuesday July 06 2023 09:55:23 EDT Ventricular Rate:  60 PR Interval:  184 QRS Duration:  70 QT Interval:  408 QTC Calculation: 408 R Axis:   13  Text Interpretation: Normal sinus rhythm Low voltage QRS Cannot rule out Anterior infarct , age undetermined No significant change since last tracing Confirmed by Marlyse Single [phone]) on 07/06/2023 9:57:04 AM   Results LABS Potassium: 4 (07/05/2023) Creatinine: 0.96 (07/05/2023)    Risk Assessment/Calculations:     HYPERTENSION CONTROL Vitals:   07/06/23 0856 07/06/23 0955  BP: (!) 140/58 (!) 146/80    The patient's blood pressure is elevated above target today.  In order to address the patient's elevated BP: A new medication was prescribed today.          Physical Exam:   VS:  BP (!) 146/80   Pulse 72   Ht 5\' 2"  (1.575 m)   Wt 161 lb (73 kg)   SpO2 98%   BMI 29.45 kg/m    Wt Readings from Last 3 Encounters:  07/06/23 161 lb (73 kg)  07/01/23 158 lb 4.6 oz (71.8 kg)  06/04/23 158 lb 4.6 oz (71.8 kg)    Constitutional:      Appearance: Healthy appearance. Not in distress.  Neck:     Vascular: JVR present. JVD normal.  Pulmonary:     Breath sounds: No wheezing. Bibasilar Rales (??) present.  Cardiovascular:  Normal rate. Regular rhythm.     Murmurs: There is no murmur.  Edema:    Peripheral edema present.    Pretibial: bilateral trace edema of the pretibial area.    Ankle: bilateral 1+ edema of the ankle. Abdominal:     Palpations: Abdomen is soft.        Assessment and Plan:   Assessment & Plan Precordial chest pain Intermittent chest pain, possibly cardiac in origin, with exertional and rest pain. Previous CT scan did demonstrate coronary calcification.   - Continue aspirin  81 mg daily - Order Lexiscan myocardial perfusion imaging to rule out ischemia - Follow up 6-8 weeks. SOB  (shortness of breath) He presents with leg edema and dyspnea on exertion. He has ? Rales at the bases on exam and +HJR. His exam is s/w difficult. He may have edema related to Amlodipine . He did not notice much improvement with Lasix  x 3 days. Recent Echocardiogram did show mild diastolic dysfunction, normal EF. - Order CXR - Order BNP, CBC, TSH - If BNP elevated, resume Lasix   - If BNP significantly elevated, consider repeat echocardiogram.  - Follow up 6-8 weeks.  Essential hypertension Question if edema due to Amlodipine  - Decrease Amlodipine  to 5 mg once daily - Start Losartan 50 mg once daily - BMET in 2 weeks. History of stroke History of recent admission to the hospital in January with right PCA stroke.  Neck CTA demonstrated moderate stenosis of the right P2 segment.  MRI demonstrated remote lacunar infarct of the left thalamus and posterior limb right internal capsule.  Echocardiogram demonstrated normal EF.  Follow-up cardiac monitor demonstrated no atrial fibrillation. He has residual R hand symptoms. He has not seen neuro. -Refer to neurology -Continue ASA 81 mg once daily.       Informed Consent   Shared Decision Making/Informed Consent The risks [chest pain, shortness of breath, cardiac arrhythmias, dizziness, blood pressure fluctuations, myocardial infarction, stroke/transient ischemic attack, nausea, vomiting, allergic reaction, radiation exposure, metallic taste sensation and life-threatening complications (estimated to be 1 in 10,000)], benefits (risk stratification, diagnosing coronary artery disease, treatment guidance) and alternatives of a nuclear stress test were discussed in detail with Todd [name] and he agrees to proceed.     Dispo:  Return in about 6 weeks (around 08/17/2023) for 6-8 WEEK F/U DR. Katheryne Pane, HAO, Charne Mcbrien.  Signed, Marlyse Single, PA-C

## 2023-07-06 ENCOUNTER — Encounter: Payer: Self-pay | Admitting: Physician Assistant

## 2023-07-06 ENCOUNTER — Ambulatory Visit (HOSPITAL_COMMUNITY)
Admission: RE | Admit: 2023-07-06 | Discharge: 2023-07-06 | Disposition: A | Discharge status: Home / Self Care | Source: Ambulatory Visit | Attending: Physician Assistant | Admitting: Physician Assistant

## 2023-07-06 ENCOUNTER — Telehealth (HOSPITAL_COMMUNITY): Payer: Self-pay | Admitting: *Deleted

## 2023-07-06 ENCOUNTER — Ambulatory Visit: Attending: Physician Assistant | Admitting: Physician Assistant

## 2023-07-06 ENCOUNTER — Other Ambulatory Visit: Payer: Self-pay | Admitting: *Deleted

## 2023-07-06 VITALS — BP 146/80 | HR 72 | Ht 62.0 in | Wt 161.0 lb

## 2023-07-06 DIAGNOSIS — R0602 Shortness of breath: Secondary | ICD-10-CM | POA: Insufficient documentation

## 2023-07-06 DIAGNOSIS — R002 Palpitations: Secondary | ICD-10-CM | POA: Insufficient documentation

## 2023-07-06 DIAGNOSIS — R072 Precordial pain: Secondary | ICD-10-CM

## 2023-07-06 DIAGNOSIS — I10 Essential (primary) hypertension: Secondary | ICD-10-CM | POA: Insufficient documentation

## 2023-07-06 DIAGNOSIS — E782 Mixed hyperlipidemia: Secondary | ICD-10-CM

## 2023-07-06 DIAGNOSIS — Z8673 Personal history of transient ischemic attack (TIA), and cerebral infarction without residual deficits: Secondary | ICD-10-CM

## 2023-07-06 DIAGNOSIS — R079 Chest pain, unspecified: Secondary | ICD-10-CM | POA: Insufficient documentation

## 2023-07-06 DIAGNOSIS — R600 Localized edema: Secondary | ICD-10-CM

## 2023-07-06 MED ORDER — LOSARTAN POTASSIUM 25 MG PO TABS: 25.0000 mg | ORAL_TABLET | Freq: Every day | ORAL | 3 refills | Status: DC

## 2023-07-06 MED ORDER — AMLODIPINE BESYLATE 5 MG PO TABS: 5.0000 mg | ORAL_TABLET | Freq: Every day | ORAL | 3 refills | Status: DC

## 2023-07-06 MED ORDER — LOSARTAN POTASSIUM 50 MG PO TABS: 50.0000 mg | ORAL_TABLET | Freq: Every day | ORAL | 3 refills | Status: AC

## 2023-07-06 NOTE — Assessment & Plan Note (Signed)
 Question if edema due to Amlodipine  - Decrease Amlodipine  to 5 mg once daily - Start Losartan 50 mg once daily - BMET in 2 weeks.

## 2023-07-06 NOTE — Patient Instructions (Signed)
 Medication Instructions:  Your physician has recommended you make the following change in your medication:  REDUCE Amlodipine  to 5 mg taking 1 daily  START Losartan 50 mg taking 1 daily  *If you need a refill on your cardiac medications before your next appointment, please call your pharmacy*  Lab Work: TODAY:  PRO BNP, CBC, & TSH  2 WEEKS, COME BACK TO [facility] ON THE 1ST FLOOR FOR:  BMET  If you have labs (blood work) drawn today and your tests are completely normal, you will receive your results only by: MyChart Message (if you have MyChart) OR A paper copy in the mail If you have any lab test that is abnormal or we need to change your treatment, we will call you to review the results.  Testing/Procedures: A chest x-ray takes a picture of the organs and structures inside the chest, including the heart, lungs, and blood vessels. This test can show several things, including, whether the heart is enlarges; whether fluid is building up in the lungs; and whether pacemaker / defibrillator leads are still in place.  GO DOWN TO THE 2ND FLOOR TODAY FOR THIS   Your physician has requested that you have a lexiscan myoview. For further information please visit [email or web address] Please follow instruction sheet, BELOW:    You are scheduled for a Myocardial Perfusion Imaging Study.  Please arrive 15 minutes prior to your appointment time for registration and insurance purposes.  The test will take approximately 3 to 4 hours to complete; you may bring reading material.  If someone comes with you to your appointment, they will need to remain in the main lobby due to limited space in the testing area. **If you are pregnant or breastfeeding, please notify the nuclear lab prior to your appointment**  How to prepare for your Myocardial Perfusion Test: Do not eat or drink 3 hours prior to your test, except you may have water. Do not consume products containing caffeine (regular or decaffeinated) 12  hours prior to your test. (ex: coffee, chocolate, sodas, tea). Do bring a list of your current medications with you.  If not listed below, you may take your medications as normal. Do wear comfortable clothes (no dresses or overalls) and walking shoes, tennis shoes preferred (No heels or open toe shoes are allowed). Do NOT wear cologne, perfume, aftershave, or lotions (deodorant is allowed). If these instructions are not followed, your test will have to be rescheduled.    Follow-Up: At [facility], you and your health needs are our priority.  As part of our continuing mission to provide you with exceptional heart care, our providers are all part of one team.  This team includes your primary Cardiologist (physician) and Advanced Practice Providers or APPs (Physician Assistants and Nurse Practitioners) who all work together to provide you with the care you need, when you need it.  Your next appointment:   6-8  week(s)  Provider:   Lauro Portal, MD or Ervin Heath, PA-C or Marlyse Single, PA-C          We recommend signing up for the patient portal called "MyChart".  Sign up information is provided on this After Visit Summary.  MyChart is used to connect with patients for Virtual Visits (Telemedicine).  Patients are able to view lab/test results, encounter notes, upcoming appointments, etc.  Non-urgent messages can be sent to your provider as well.   To learn more about what you can do with MyChart, go to [email or web address].   Other Instructions  You have been referred to Dr. Janett Medin at Neurology

## 2023-07-06 NOTE — Telephone Encounter (Signed)
 Patient's daughter, per dpr using language line, given instructions for upcoming stress test.  Todd [name]

## 2023-07-07 ENCOUNTER — Telehealth: Payer: Self-pay | Admitting: Physician Assistant

## 2023-07-07 ENCOUNTER — Other Ambulatory Visit: Payer: Self-pay

## 2023-07-07 LAB — CBC
Hematocrit: 46.1 % (ref 37.5–51.0)
Hemoglobin: 14.6 g/dL (ref 13.0–17.7)
MCH: 26.7 pg (ref 26.6–33.0)
MCHC: 31.7 g/dL (ref 31.5–35.7)
MCV: 84 fL (ref 79–97)
Platelets: 195 10*3/uL (ref 150–450)
RBC: 5.47 x10E6/uL (ref 4.14–5.80)
RDW: 13.7 % (ref 11.6–15.4)
WBC: 7.4 10*3/uL (ref 3.4–10.8)

## 2023-07-07 LAB — TSH: TSH: 2.48 u[IU]/mL (ref 0.450–4.500)

## 2023-07-07 LAB — PRO B NATRIURETIC PEPTIDE: NT-Pro BNP: 111 pg/mL (ref 0–486)

## 2023-07-07 MED ORDER — AMLODIPINE BESYLATE 5 MG PO TABS: 5.0000 mg | ORAL_TABLET | Freq: Every day | ORAL | 3 refills | Status: AC

## 2023-07-07 NOTE — Telephone Encounter (Signed)
 Spoke to patient's daughter with help of spanish interpreter.Daughter stated she only picked up Losartan 50 mg yesterday.She did not receive Amlodipine  5 mg.New prescription for Amlodipine  5 mg sent to [facility] [address]

## 2023-07-07 NOTE — Telephone Encounter (Signed)
 Pt c/o medication issue:  1. Name of Medication:   amLODipine  (NORVASC ) 5 MG tablet    2. How are you currently taking this medication (dosage and times per day)? As Written  3. Are you having a reaction (difficulty breathing--STAT)? No   4. What is your medication issue? Daughter went to pharmacy and they told her they did not have script. Please advise

## 2023-07-08 ENCOUNTER — Encounter: Admitting: Internal Medicine

## 2023-07-09 ENCOUNTER — Other Ambulatory Visit: Payer: Self-pay | Admitting: Physician Assistant

## 2023-07-09 ENCOUNTER — Encounter: Payer: Self-pay | Admitting: Physician Assistant

## 2023-07-09 DIAGNOSIS — Z8673 Personal history of transient ischemic attack (TIA), and cerebral infarction without residual deficits: Secondary | ICD-10-CM

## 2023-07-09 DIAGNOSIS — I10 Essential (primary) hypertension: Secondary | ICD-10-CM

## 2023-07-09 DIAGNOSIS — R072 Precordial pain: Secondary | ICD-10-CM

## 2023-07-09 DIAGNOSIS — R0602 Shortness of breath: Secondary | ICD-10-CM

## 2023-07-12 NOTE — Progress Notes (Signed)
 Pt'S daughter, Renold Cashing, [place] on file, has been made aware of normal result and verbalized understanding.  jw

## 2023-07-14 ENCOUNTER — Ambulatory Visit (HOSPITAL_COMMUNITY): Attending: Physician Assistant

## 2023-07-14 DIAGNOSIS — I10 Essential (primary) hypertension: Secondary | ICD-10-CM | POA: Insufficient documentation

## 2023-07-14 DIAGNOSIS — R0602 Shortness of breath: Secondary | ICD-10-CM | POA: Insufficient documentation

## 2023-07-14 DIAGNOSIS — Z8673 Personal history of transient ischemic attack (TIA), and cerebral infarction without residual deficits: Secondary | ICD-10-CM | POA: Insufficient documentation

## 2023-07-14 DIAGNOSIS — R072 Precordial pain: Secondary | ICD-10-CM | POA: Diagnosis present

## 2023-07-14 LAB — MYOCARDIAL PERFUSION IMAGING
LV dias vol: 59 mL (ref 62–150)
LV sys vol: 14 mL
Nuc Stress EF: 76 %
Peak HR: 99 {beats}/min
Rest HR: 94 {beats}/min
Rest Nuclear Isotope Dose: 10 mCi
SDS: 2
SRS: 2
SSS: 4
ST Depression (mm): 0 mm
Stress Nuclear Isotope Dose: 32.9 mCi
TID: 0.81

## 2023-07-14 MED ORDER — TECHNETIUM TC 99M TETROFOSMIN IV KIT
32.9000 | PACK | Freq: Once | INTRAVENOUS | Status: AC | PRN
Start: 2023-07-14 — End: 2023-07-14
  Administered 2023-07-14: 32.9 via INTRAVENOUS

## 2023-07-14 MED ORDER — TECHNETIUM TC 99M TETROFOSMIN IV KIT
10.0000 | PACK | Freq: Once | INTRAVENOUS | Status: AC | PRN
  Administered 2023-07-14: 10 via INTRAVENOUS

## 2023-07-14 MED ORDER — REGADENOSON 0.4 MG/5ML IV SOLN
0.4000 mg | Freq: Once | INTRAVENOUS | Status: AC
  Administered 2023-07-14: 0.4 mg via INTRAVENOUS

## 2023-07-14 MED ORDER — REGADENOSON 0.4 MG/5ML IV SOLN
INTRAVENOUS | Status: AC
  Filled 2023-07-14: qty 5

## 2023-07-15 ENCOUNTER — Telehealth: Payer: Self-pay | Admitting: *Deleted

## 2023-07-15 ENCOUNTER — Encounter: Payer: Self-pay | Admitting: Physician Assistant

## 2023-07-15 DIAGNOSIS — I3131 Malignant pericardial effusion in diseases classified elsewhere: Secondary | ICD-10-CM

## 2023-07-15 DIAGNOSIS — I2510 Atherosclerotic heart disease of native coronary artery without angina pectoris: Secondary | ICD-10-CM | POA: Insufficient documentation

## 2023-07-15 DIAGNOSIS — I3139 Other pericardial effusion (noninflammatory): Secondary | ICD-10-CM

## 2023-07-15 HISTORY — DX: Atherosclerotic heart disease of native coronary artery without angina pectoris: I25.10

## 2023-07-15 NOTE — Telephone Encounter (Signed)
-----   Message from [place] sent at 07/15/2023  8:56 AM EDT ----- Stress test is normal. There was a small pericardial effusion noted. We should get a follow up echocardiogram to investigate this further.  PLAN:  - Continue current medications/treatment plan and follow up as scheduled.  - Schedule Echocardiogram (Dx Pericardial Effusion) Marlyse Single, PA-C    07/15/2023 8:52 AM

## 2023-07-16 ENCOUNTER — Ambulatory Visit (HOSPITAL_COMMUNITY): Attending: Cardiology

## 2023-07-16 DIAGNOSIS — I3139 Other pericardial effusion (noninflammatory): Secondary | ICD-10-CM | POA: Insufficient documentation

## 2023-07-16 LAB — ECHOCARDIOGRAM LIMITED: Area-P 1/2: 3.21 cm2

## 2023-07-20 ENCOUNTER — Ambulatory Visit: Payer: Self-pay | Admitting: Physician Assistant

## 2023-07-20 ENCOUNTER — Encounter: Payer: Self-pay | Admitting: Physician Assistant

## 2023-07-20 ENCOUNTER — Ambulatory Visit: Admitting: Cardiovascular Disease

## 2023-07-20 ENCOUNTER — Ambulatory Visit (HOSPITAL_BASED_OUTPATIENT_CLINIC_OR_DEPARTMENT_OTHER): Payer: Self-pay | Admitting: *Deleted

## 2023-07-20 DIAGNOSIS — I3139 Other pericardial effusion (noninflammatory): Secondary | ICD-10-CM | POA: Insufficient documentation

## 2023-07-20 HISTORY — DX: Other pericardial effusion (noninflammatory): I31.39

## 2023-07-21 LAB — BASIC METABOLIC PANEL WITH GFR
BUN/Creatinine Ratio: 12 (ref 10–24)
BUN: 13 mg/dL (ref 10–36)
CO2: 20 mmol/L (ref 20–29)
Calcium: 8.7 mg/dL (ref 8.6–10.2)
Chloride: 103 mmol/L (ref 96–106)
Creatinine, Ser: 1.11 mg/dL (ref 0.76–1.27)
Glucose: 107 mg/dL — ABNORMAL HIGH (ref 70–99)
Potassium: 4.2 mmol/L (ref 3.5–5.2)
Sodium: 141 mmol/L (ref 134–144)
eGFR: 63 mL/min/{1.73_m2} (ref >59)

## 2023-07-23 ENCOUNTER — Telehealth: Payer: Self-pay | Admitting: Cardiovascular Disease

## 2023-07-23 MED ORDER — COLCHICINE 0.6 MG PO TABS: 0.6000 mg | ORAL_TABLET | Freq: Every day | ORAL | 2 refills | Status: AC

## 2023-07-23 NOTE — Telephone Encounter (Signed)
 Patient identification verified by 2 forms. Sims Duck, RN    Patient daughter calling in. Todd [name]  Todd [name] states:  - headache and high BP which was 189/100 this morning. This reading recorded after morning medications administered.  - patient has high temperature  - patient does not seem as coherent as their used to. When calling patients name daughter has to repeat several times to get patient attention. However patient is declining to have family take him to ED.  - patient is having trouble with balance when trying to stand up or walk - body feels heavy when trying to stand up  - patient experiencing some SOB   Patient denies:  - swelling, falls, blurred vision, one-sided weakness,n/v              Interventions/Plan: - Encounter reviewed with DOD Barron Lien. Per DOD patient to increase Amlodipine  to 10 mg by mouth daily and increase Losartan  to 100 mg by mouth daily. Patient to f/u with APP for hypertension in 3 weeks or first available. Patient already scheduled with Dr. Katheryne Pane on 6/10. That is soonest appt. Recommended patient keep this appt.    Reviewed ED warning signs/precautions  Todd [name] agrees with plan, no questions at this time

## 2023-07-23 NOTE — Telephone Encounter (Signed)
   Pt c/o BP issue: STAT if pt c/o blurred vision, one-sided weakness or slurred speech.  STAT if BP is GREATER than 180/120 TODAY.  STAT if BP is LESS than 90/60 and SYMPTOMATIC TODAY  1. What is your BP concern? Pt daughter called in stating pt bp is high   2. Have you taken any BP medication today? Yes   3. What are your last 5 BP readings? 189/100 - currently   4. Are you having any other symptoms (ex. Dizziness, headache, blurred vision, passed out)? Headache, fever. He explained that pt isn't able to pay attention when they are speaking with him, please advise. She also stated pt did not want to go to the ED.

## 2023-07-26 ENCOUNTER — Ambulatory Visit (HOSPITAL_COMMUNITY)
Admission: RE | Admit: 2023-07-26 | Discharge: 2023-07-26 | Disposition: A | Discharge status: Home / Self Care | Source: Ambulatory Visit | Attending: Physician Assistant | Admitting: Physician Assistant

## 2023-07-26 ENCOUNTER — Ambulatory Visit: Payer: Self-pay | Admitting: Physician Assistant

## 2023-07-26 DIAGNOSIS — I3139 Other pericardial effusion (noninflammatory): Secondary | ICD-10-CM | POA: Diagnosis present

## 2023-07-26 LAB — ECHOCARDIOGRAM LIMITED: Area-P 1/2: 2.09 cm2

## 2023-08-03 ENCOUNTER — Ambulatory Visit: Payer: Self-pay | Admitting: Physician Assistant

## 2023-08-13 ENCOUNTER — Ambulatory Visit (HOSPITAL_COMMUNITY)
Admission: RE | Admit: 2023-08-13 | Discharge: 2023-08-13 | Disposition: A | Discharge status: Home / Self Care | Source: Ambulatory Visit | Attending: Physician Assistant | Admitting: Physician Assistant

## 2023-08-13 DIAGNOSIS — I3139 Other pericardial effusion (noninflammatory): Secondary | ICD-10-CM | POA: Insufficient documentation

## 2023-08-13 MED ORDER — IOHEXOL 300 MG/ML  SOLN
50.0000 mL | Freq: Once | INTRAMUSCULAR | Status: AC | PRN
  Administered 2023-08-13: 50 mL via INTRAVENOUS

## 2023-08-14 ENCOUNTER — Other Ambulatory Visit: Payer: Self-pay | Admitting: Physician Assistant

## 2023-08-16 ENCOUNTER — Ambulatory Visit: Payer: Self-pay | Admitting: Physician Assistant

## 2023-08-16 DIAGNOSIS — E041 Nontoxic single thyroid nodule: Secondary | ICD-10-CM

## 2023-08-17 ENCOUNTER — Encounter: Payer: Self-pay | Admitting: Cardiovascular Disease

## 2023-08-17 ENCOUNTER — Ambulatory Visit: Attending: Cardiovascular Disease | Admitting: Cardiovascular Disease

## 2023-08-17 VITALS — BP 140/70 | HR 80 | Wt 161.0 lb

## 2023-08-17 DIAGNOSIS — E1159 Type 2 diabetes mellitus with other circulatory complications: Secondary | ICD-10-CM | POA: Diagnosis not present

## 2023-08-17 DIAGNOSIS — E782 Mixed hyperlipidemia: Secondary | ICD-10-CM

## 2023-08-17 DIAGNOSIS — I63539 Cerebral infarction due to unspecified occlusion or stenosis of unspecified posterior cerebral artery: Secondary | ICD-10-CM

## 2023-08-17 DIAGNOSIS — I152 Hypertension secondary to endocrine disorders: Secondary | ICD-10-CM

## 2023-08-17 DIAGNOSIS — I3139 Other pericardial effusion (noninflammatory): Secondary | ICD-10-CM

## 2023-08-17 DIAGNOSIS — I2510 Atherosclerotic heart disease of native coronary artery without angina pectoris: Secondary | ICD-10-CM

## 2023-08-17 DIAGNOSIS — R0609 Other forms of dyspnea: Secondary | ICD-10-CM | POA: Diagnosis not present

## 2023-08-17 DIAGNOSIS — R600 Localized edema: Secondary | ICD-10-CM | POA: Insufficient documentation

## 2023-08-17 NOTE — Assessment & Plan Note (Signed)
 History of hyperlipidemia on statin therapy with lipid profile performed 03/23/2023 revealing total cholesterol 127, LDL 78 and HDL of 38.

## 2023-08-17 NOTE — Assessment & Plan Note (Signed)
 History of CVA in January of this year with negative 2D echo/bubble study and no evidence of A-fib on Zio patch.

## 2023-08-17 NOTE — Assessment & Plan Note (Signed)
 Pericardial effusion which was moderate by 2D echo performed 07/26/2023.  Patient is on colchicine .  He is asymptomatic from this although the etiology is unclear.  Will recheck a 2D echo in 3 months.

## 2023-08-17 NOTE — Assessment & Plan Note (Signed)
 Coronary calcification seen on chest CT with Myoview  performed 07/14/2023 that was nonischemic.  Patient denies chest pain.

## 2023-08-17 NOTE — Assessment & Plan Note (Signed)
 History of dyspnea on exertion in the past which no longer is an issue.

## 2023-08-17 NOTE — Progress Notes (Signed)
 08/17/2023 Todd [name]   [birth year]  [id number]  Primary Physician Deanna Expose, MD Primary Cardiologist: Avanell Leigh MD Bennye Bravo, [place]  HPI:  Todd [name] is a 89 y.o.  married Latino male from [place] who is accompanied by his daughter Todd [name] today. There is an interpreter here as well. He does not speak [place]. He was referred by Dr. Verona Goodwill office for evaluation of dyspnea on exertion.  I last saw him in the office 10/20/2021.  He does have a history of treated hypertension and hyperlipidemia. He is quit smoking 30 years ago. There is no family history for heart disease. He is never had heart attack or stroke. He denies chest pain. He had dyspnea on exertion for last 3 years for unclear reasons. He did have an echocardiogram performed in 2014 which was essentially normal.  Since I saw him 2 years ago he did have a stroke back in January.  2D echo was unrevealing as well as an event monitor.  He was complaining some dyspnea and some atypical chest pain.  He no longer has any symptoms.  Myoview  stress test was performed that was low risk and nonischemic.  2D echo performed 07/26/2023 that showed normal LV systolic function with a moderate circumferential pericardial effusion.  He has begun on colchicine .  The etiology of pericardial effusion is unclear.   Current Meds  Medication Sig   amLODipine  (NORVASC ) 5 MG tablet Take 1 tablet (5 mg total) by mouth daily.   aspirin  EC 81 MG tablet Take 1 tablet (81 mg total) by mouth daily. Swallow whole.   atorvastatin  (LIPITOR) 10 MG tablet Take 1 tablet by mouth daily.   colchicine  0.6 MG tablet Take 1 tablet (0.6 mg total) by mouth daily.   losartan  (COZAAR ) 50 MG tablet Take 1 tablet (50 mg total) by mouth daily.   metoprolol  tartrate (LOPRESSOR ) 25 MG tablet Take 25 mg by mouth 2 (two) times daily.     No Known Allergies  Social History   Socioeconomic History   Marital status: Married     Spouse name: Not on file   Number of children: Not on file   Years of education: 0   Highest education level: Not on file  Occupational History   Not on file  Tobacco Use   Smoking status: Never   Smokeless tobacco: Never  Vaping Use   Vaping status: Never Used  Substance and Sexual Activity   Alcohol use: Not Currently   Drug use: Never   Sexual activity: Not Currently  Other Topics Concern   Not on file  Social History Narrative   ** Merged History Encounter **       Diet:  Caffeine: Yes  Married, if yes what year: single  Do you live in a house, apartment, assisted living, condo, trailer, ect: house  Is it one or more stories:   How many persons live in your home? 4  Pets: None  Highest level or educat   ion completed: no education  Current/Past profession: Past farmer  Exercise: no                 Type and how often:    Living Will: no DNR: no POA/HPOA: no  Functional Status: Do you have difficulty bathing or dressing yourself? no Do you ha   ve difficulty preparing food or eating? no Do you have difficulty managing your medications? no Do you have difficulty managing your finances? no  Do you have difficulty affording your medications? no    Social Drivers of Corporate investment banker Strain: Not on file  Food Insecurity: No Food Insecurity (03/23/2023)   Hunger Vital Sign    Worried About Running Out of Food in the Last Year: Never true    Ran Out of Food in the Last Year: Never true  Transportation Needs: No Transportation Needs (03/23/2023)   PRAPARE - Administrator, Civil Service (Medical): No    Lack of Transportation (Non-Medical): No  Physical Activity: Not on file  Stress: Not on file  Social Connections: Unknown (03/23/2023)   Social Connection and Isolation Panel [NHANES]    Frequency of Communication with Friends and Family: More than three times a week    Frequency of Social Gatherings with Friends and Family: More than  three times a week    Attends Religious Services: More than 4 times per year    Active Member of [employer] or Organizations: No    Attends Banker Meetings: Never    Marital Status: Patient unable to answer  Intimate Partner Violence: Not At Risk (03/23/2023)   Humiliation, Afraid, Rape, and Kick questionnaire    Fear of Current or Ex-Partner: No    Emotionally Abused: No    Physically Abused: No    Sexually Abused: No     Review of Systems: General: negative for chills, fever, night sweats or weight changes.  Cardiovascular: negative for chest pain, dyspnea on exertion, edema, orthopnea, palpitations, paroxysmal nocturnal dyspnea or shortness of breath Dermatological: negative for rash Respiratory: negative for cough or wheezing Urologic: negative for hematuria Abdominal: negative for nausea, vomiting, diarrhea, bright red blood per rectum, melena, or hematemesis Neurologic: negative for visual changes, syncope, or dizziness All other systems reviewed and are otherwise negative except as noted above.    Blood pressure (!) 140/70, pulse 80, weight 161 lb (73 kg), SpO2 96%.  General appearance: alert and no distress Neck: no adenopathy, no carotid bruit, no JVD, supple, symmetrical, trachea midline, and thyroid not enlarged, symmetric, no tenderness/mass/nodules Lungs: clear to auscultation bilaterally Heart: regular rate and rhythm, S1, S2 normal, no murmur, click, rub or gallop Extremities: extremities normal, atraumatic, no cyanosis or edema Pulses: 2+ and symmetric Skin: Skin color, texture, turgor normal. No rashes or lesions Neurologic: Grossly normal  EKG not performed today      ASSESSMENT AND PLAN:   Hypertension associated with diabetes (HCC) History of essential hypertension with blood pressure measured today at 140/70.  He is on amlodipine  which was down titrated by Marlyse Single because of lower extremity edema in addition to losartan  and  Lopressor .  Hyperlipidemia History of hyperlipidemia on statin therapy with lipid profile performed 03/23/2023 revealing total cholesterol 127, LDL 78 and HDL of 38.  Dyspnea on exertion History of dyspnea on exertion in the past which no longer is an issue.  Cerebrovascular accident (CVA) [facility]) History of CVA in January of this year with negative 2D echo/bubble study and no evidence of A-fib on Zio patch.  Coronary artery calcification seen on CT scan Coronary calcification seen on chest CT with Myoview  performed 07/14/2023 that was nonischemic.  Patient denies chest pain.  Pericardial effusion Pericardial effusion which was moderate by 2D echo performed 07/26/2023.  Patient is on colchicine .  He is asymptomatic from this although the etiology is unclear.  Will recheck a 2D echo in 3 months.  Bilateral lower extremity edema Improved with down titration of amlodipine .  Avanell Leigh MD FACP,FACC,FAHA, [facility] 08/17/2023 4:44 PM

## 2023-08-17 NOTE — Patient Instructions (Signed)
 Medication Instructions:  None *If you need a refill on your cardiac medications before your next appointment, please call your pharmacy*  Testing/Procedures: Your physician has requested that you have an echocardiogram in 3 months. Echocardiography is a painless test that uses sound waves to create images of your heart. It provides your doctor with information about the size and shape of your heart and how well your heart's chambers and valves are working. This procedure takes approximately one hour. There are no restrictions for this procedure. Please do NOT wear cologne, perfume, aftershave, or lotions (deodorant is allowed). Please arrive 15 minutes prior to your appointment time.  Please note: We ask at that you not bring children with you during ultrasound (echo/ vascular) testing. Due to room size and safety concerns, children are not allowed in the ultrasound rooms during exams. Our front office staff cannot provide observation of children in our lobby area while testing is being conducted. An adult accompanying a patient to their appointment will only be allowed in the ultrasound room at the discretion of the ultrasound technician under special circumstances. We apologize for any inconvenience.   Follow-Up: At [facility], you and your health needs are our priority.  As part of our continuing mission to provide you with exceptional heart care, our providers are all part of one team.  This team includes your primary Cardiologist (physician) and Advanced Practice Providers or APPs (Physician Assistants and Nurse Practitioners) who all work together to provide you with the care you need, when you need it.  Your next appointment:    3-4 months with Marlyse Single, PA (after Echo)  1 year with Dr Katheryne Pane    We recommend signing up for the patient portal called "MyChart".  Sign up information is provided on this After Visit Summary.  MyChart is used to connect with patients for Virtual  Visits (Telemedicine).  Patients are able to view lab/test results, encounter notes, upcoming appointments, etc.  Non-urgent messages can be sent to your provider as well.   To learn more about what you can do with MyChart, go to [email or web address].

## 2023-08-17 NOTE — Assessment & Plan Note (Addendum)
 History of essential hypertension with blood pressure measured today at 140/70.  He is on amlodipine  which was down titrated by Marlyse Single because of lower extremity edema in addition to losartan  and Lopressor .

## 2023-08-17 NOTE — Assessment & Plan Note (Signed)
 Improved with down titration of amlodipine .

## 2023-11-04 ENCOUNTER — Encounter: Admitting: Internal Medicine

## 2023-11-13 ENCOUNTER — Other Ambulatory Visit: Payer: Self-pay | Admitting: Physician Assistant

## 2023-11-17 ENCOUNTER — Ambulatory Visit (HOSPITAL_COMMUNITY)
Admission: RE | Admit: 2023-11-17 | Source: Ambulatory Visit | Attending: Cardiovascular Disease | Admitting: Cardiovascular Disease

## 2023-11-22 ENCOUNTER — Encounter (HOSPITAL_COMMUNITY): Payer: Self-pay | Admitting: Cardiovascular Disease

## 2023-11-24 ENCOUNTER — Ambulatory Visit: Attending: Cardiology | Admitting: Physician Assistant

## 2023-11-24 NOTE — Progress Notes (Deleted)
 OFFICE NOTE:    Date:  11/24/2023  ID:  Todd [name], DOB 1930-12-20, MRN [id number] PCP: Murleen Fine, MD  [facility] Providers Cardiologist:  Dorn Lesches, MD { Click to update primary MD,subspecialty MD or APP then REFRESH:1}      *** Coronary artery calcification MPI 07/14/2023: No ischemia or infarction, EF 76, small pericardial effusion, minimal CAC on correction CT images Pericardial effusion Limited TTE 07/16/2023: EF 65-70, no RWMA, normal RVSF, moderate pericardial effusion, no tamponade Limited TTE 07/26/2023: EF 60-65, normal RVSF, moderate pericardial effusion, no tamponade Hypertension Hyperlipidemia S/p CVA in 03/2023 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 History of Present Illness Todd [name] is a 89 y.o. male who returns for follow up of pericardial effusion. He was last seen in clinic by Dr. Lesches in June 2025. He had been seen in 06/2023 with intermittent chest pain, edema, dyspnea on exertion. Amlodipine  was reduced to help with edema. He was referred to neuro for residual R hand symptoms since his stroke.  Nuclear stress test was obtained and demonstrated no evidence of ischemia or infarction.  There was suggestion of a small effusion.  Follow-up limited TTE did demonstrate moderate pericardial effusion but no evidence of tamponade.  He was placed on colchicine .  Repeat limited TTE 07/26/2023 demonstrated moderate pericardial effusion and no evidence of tamponade. Chest CT showed no lung masses or mediastinal adenopathy. There was a thyroid nodule that has been evaluated by primary care previously. He had a follow up TTE scheduled last week. However, he did not show for  the appt.     ROS-See HPI***    Studies Reviewed:      Labs - chart review 07/20/2023: Creatinine 1.11, K 4.2, Hgb 14.6, NT-proBNP 111, TSH 2.48 CXR 07/06/2023: No acute disease  Results  Risk Assessment/Calculations: {Does this patient have ATRIAL FIBRILLATION?[phone]} No BP recorded.  {Refresh Note OR Click here to enter BP  :1}***      Physical Exam:  VS:  There were no vitals taken for this visit.       Wt Readings from Last 3 Encounters:  08/17/23 161 lb (73 kg)  07/06/23 161 lb (73 kg)  07/01/23 158 lb 4.6 oz (71.8 kg)    Physical Exam***     Assessment and Plan:    Assessment & Plan Coronary artery calcification seen on CT scan Noted on CT in the past. Pt was seen in 06/2023 for chest pain. MPI was neg for ischemia, low risk. *** Pericardial effusion Mod effusion noted on echocardiogram in 07/2023. Pt had been seen for chest pain. Question if he had pericarditis. He was started on Colchicine  at that time. CT showed no findings suspicious for malignancy. Plan was to get a repeat TTE last week to reassess the effusion. This was not done. *** History of stroke R PCA stroke in 03/2023. Monitor was neg for AFib. Bubble study was neg. *** Mixed hyperlipidemia  Essential hypertension  Assessment and Plan Assessment & Plan    {      :1}    {Are you ordering a CV Procedure (e.g. stress test, cath, DCCV, TEE, etc)?   Press F2        :  [id number]}  Dispo:  No follow-ups on file.  Signed, Glendia Ferrier, PA-C

## 2023-11-24 NOTE — Assessment & Plan Note (Deleted)
 Noted on CT in the past. Pt was seen in 06/2023 for chest pain. MPI was neg for ischemia, low risk. ***

## 2023-11-24 NOTE — Assessment & Plan Note (Deleted)
 Mod effusion noted on echocardiogram in 07/2023. Pt had been seen for chest pain. Question if he had pericarditis. He was started on Colchicine  at that time. CT showed no findings suspicious for malignancy. Plan was to get a repeat TTE last week to reassess the effusion. This was not done. ***

## 2024-02-08 ENCOUNTER — Telehealth (HOSPITAL_COMMUNITY): Payer: Self-pay | Admitting: Cardiovascular Disease

## 2024-02-08 ENCOUNTER — Ambulatory Visit (HOSPITAL_COMMUNITY): Attending: Cardiovascular Disease

## 2024-02-08 NOTE — Telephone Encounter (Signed)
 Patient NO SHOWED scheduled echocardiogram for 02/08/24. See below:  02/08/24 NO SHOWED X 2 We will not reach out to patient to reschedule due to NO SHOW RATE of 21%/LBW  11/17/23 NO SHOWED- MAILED LETTER AND COPIED MD/LBW   Order will be removed from the echo WQ. Thank you.

## 2024-04-07 ENCOUNTER — Telehealth: Payer: Self-pay | Admitting: Cardiovascular Disease

## 2024-04-07 DIAGNOSIS — I3139 Other pericardial effusion (noninflammatory): Secondary | ICD-10-CM

## 2024-04-07 NOTE — Telephone Encounter (Signed)
 Daughter (Cristela) called to get patient's Echocardiogram orders reinstated.

## 2024-04-09 NOTE — Addendum Note (Signed)
 Addended by: DRENA MARTINIS, Terell Kincy L on: 04/09/2024 08:09 PM   Modules accepted: Orders

## 2024-04-09 NOTE — Telephone Encounter (Signed)
 Re-ordered echo

## 2024-04-18 ENCOUNTER — Ambulatory Visit (HOSPITAL_COMMUNITY)

## 2024-04-28 ENCOUNTER — Ambulatory Visit: Admitting: Physician Assistant
